# Patient Record
Sex: Female | Born: 1960 | Race: Black or African American | Hispanic: No | Marital: Single | Smoking: Current every day smoker
Health system: Southern US, Community
[De-identification: ages and names within clinical notes are randomized; demographics above are authoritative.]

## PROBLEM LIST (undated history)

## (undated) DIAGNOSIS — F1410 Cocaine abuse, uncomplicated: Secondary | ICD-10-CM

## (undated) DIAGNOSIS — F209 Schizophrenia, unspecified: Secondary | ICD-10-CM

## (undated) DIAGNOSIS — F259 Schizoaffective disorder, unspecified: Secondary | ICD-10-CM

## (undated) DIAGNOSIS — R443 Hallucinations, unspecified: Secondary | ICD-10-CM

## (undated) DIAGNOSIS — F1010 Alcohol abuse, uncomplicated: Secondary | ICD-10-CM

## (undated) DIAGNOSIS — F250 Schizoaffective disorder, bipolar type: Secondary | ICD-10-CM

## (undated) DIAGNOSIS — F1910 Other psychoactive substance abuse, uncomplicated: Secondary | ICD-10-CM

## (undated) HISTORY — PX: ABDOMINAL SURGERY: SHX537

---

## 1998-03-24 ENCOUNTER — Emergency Department (HOSPITAL_COMMUNITY): Admission: EM | Admit: 1998-03-24 | Discharge: 1998-03-24 | Payer: Self-pay | Admitting: Emergency Medicine

## 1998-10-18 ENCOUNTER — Encounter: Payer: Self-pay | Admitting: Emergency Medicine

## 1998-10-18 ENCOUNTER — Emergency Department (HOSPITAL_COMMUNITY): Admission: EM | Admit: 1998-10-18 | Discharge: 1998-10-18 | Payer: Self-pay | Admitting: Emergency Medicine

## 1998-10-20 ENCOUNTER — Emergency Department (HOSPITAL_COMMUNITY): Admission: EM | Admit: 1998-10-20 | Discharge: 1998-10-20 | Payer: Self-pay | Admitting: Emergency Medicine

## 1998-11-05 ENCOUNTER — Inpatient Hospital Stay (HOSPITAL_COMMUNITY): Admission: AD | Admit: 1998-11-05 | Discharge: 1998-11-21 | Payer: Self-pay | Admitting: *Deleted

## 1999-07-21 ENCOUNTER — Emergency Department (HOSPITAL_COMMUNITY): Admission: EM | Admit: 1999-07-21 | Discharge: 1999-07-21 | Payer: Self-pay | Admitting: Emergency Medicine

## 1999-08-23 ENCOUNTER — Emergency Department (HOSPITAL_COMMUNITY): Admission: EM | Admit: 1999-08-23 | Discharge: 1999-08-23 | Payer: Self-pay | Admitting: Emergency Medicine

## 1999-11-08 ENCOUNTER — Emergency Department (HOSPITAL_COMMUNITY): Admission: EM | Admit: 1999-11-08 | Discharge: 1999-11-08 | Payer: Self-pay | Admitting: Emergency Medicine

## 2001-01-10 ENCOUNTER — Emergency Department (HOSPITAL_COMMUNITY): Admission: EM | Admit: 2001-01-10 | Discharge: 2001-01-11 | Payer: Self-pay | Admitting: Emergency Medicine

## 2001-01-11 ENCOUNTER — Encounter: Payer: Self-pay | Admitting: Emergency Medicine

## 2001-10-10 ENCOUNTER — Inpatient Hospital Stay (HOSPITAL_COMMUNITY): Admission: EM | Admit: 2001-10-10 | Discharge: 2001-10-13 | Payer: Self-pay | Admitting: *Deleted

## 2003-01-15 ENCOUNTER — Emergency Department (HOSPITAL_COMMUNITY): Admission: EM | Admit: 2003-01-15 | Discharge: 2003-01-15 | Payer: Self-pay | Admitting: Emergency Medicine

## 2003-01-15 ENCOUNTER — Encounter: Payer: Self-pay | Admitting: Emergency Medicine

## 2003-01-27 ENCOUNTER — Inpatient Hospital Stay (HOSPITAL_COMMUNITY): Admission: EM | Admit: 2003-01-27 | Discharge: 2003-02-01 | Payer: Self-pay | Admitting: Psychiatry

## 2003-02-11 ENCOUNTER — Emergency Department (HOSPITAL_COMMUNITY): Admission: EM | Admit: 2003-02-11 | Discharge: 2003-02-11 | Payer: Self-pay | Admitting: Emergency Medicine

## 2003-02-11 ENCOUNTER — Encounter: Payer: Self-pay | Admitting: Emergency Medicine

## 2003-02-25 ENCOUNTER — Emergency Department (HOSPITAL_COMMUNITY): Admission: EM | Admit: 2003-02-25 | Discharge: 2003-02-25 | Payer: Self-pay | Admitting: Emergency Medicine

## 2003-03-15 ENCOUNTER — Inpatient Hospital Stay (HOSPITAL_COMMUNITY): Admission: AD | Admit: 2003-03-15 | Discharge: 2003-03-19 | Payer: Self-pay | Admitting: Psychiatry

## 2003-05-28 ENCOUNTER — Emergency Department (HOSPITAL_COMMUNITY): Admission: EM | Admit: 2003-05-28 | Discharge: 2003-05-28 | Payer: Self-pay | Admitting: Emergency Medicine

## 2003-11-12 ENCOUNTER — Emergency Department (HOSPITAL_COMMUNITY): Admission: EM | Admit: 2003-11-12 | Discharge: 2003-11-13 | Payer: Self-pay | Admitting: Emergency Medicine

## 2003-12-02 ENCOUNTER — Emergency Department (HOSPITAL_COMMUNITY): Admission: EM | Admit: 2003-12-02 | Discharge: 2003-12-02 | Payer: Self-pay | Admitting: Emergency Medicine

## 2003-12-30 ENCOUNTER — Emergency Department (HOSPITAL_COMMUNITY): Admission: EM | Admit: 2003-12-30 | Discharge: 2003-12-31 | Payer: Self-pay | Admitting: Emergency Medicine

## 2004-02-19 ENCOUNTER — Emergency Department (HOSPITAL_COMMUNITY): Admission: EM | Admit: 2004-02-19 | Discharge: 2004-02-19 | Payer: Self-pay | Admitting: Emergency Medicine

## 2004-02-27 ENCOUNTER — Emergency Department (HOSPITAL_COMMUNITY): Admission: EM | Admit: 2004-02-27 | Discharge: 2004-02-27 | Payer: Self-pay

## 2004-11-20 ENCOUNTER — Emergency Department (HOSPITAL_COMMUNITY): Admission: EM | Admit: 2004-11-20 | Discharge: 2004-11-20 | Payer: Self-pay | Admitting: Emergency Medicine

## 2004-11-21 ENCOUNTER — Emergency Department (HOSPITAL_COMMUNITY): Admission: EM | Admit: 2004-11-21 | Discharge: 2004-11-21 | Payer: Self-pay | Admitting: Emergency Medicine

## 2004-11-27 ENCOUNTER — Emergency Department (HOSPITAL_COMMUNITY): Admission: EM | Admit: 2004-11-27 | Discharge: 2004-11-27 | Payer: Self-pay | Admitting: Emergency Medicine

## 2004-12-05 ENCOUNTER — Emergency Department (HOSPITAL_COMMUNITY): Admission: EM | Admit: 2004-12-05 | Discharge: 2004-12-05 | Payer: Self-pay | Admitting: Emergency Medicine

## 2005-01-18 ENCOUNTER — Emergency Department (HOSPITAL_COMMUNITY): Admission: EM | Admit: 2005-01-18 | Discharge: 2005-01-18 | Payer: Self-pay | Admitting: Emergency Medicine

## 2005-07-25 ENCOUNTER — Emergency Department (HOSPITAL_COMMUNITY): Admission: EM | Admit: 2005-07-25 | Discharge: 2005-07-25 | Payer: Self-pay | Admitting: Emergency Medicine

## 2006-04-24 ENCOUNTER — Emergency Department (HOSPITAL_COMMUNITY): Admission: EM | Admit: 2006-04-24 | Discharge: 2006-04-24 | Payer: Self-pay | Admitting: Emergency Medicine

## 2007-11-06 ENCOUNTER — Emergency Department (HOSPITAL_COMMUNITY): Admission: EM | Admit: 2007-11-06 | Discharge: 2007-11-06 | Payer: Self-pay | Admitting: Emergency Medicine

## 2007-11-21 ENCOUNTER — Emergency Department (HOSPITAL_COMMUNITY): Admission: EM | Admit: 2007-11-21 | Discharge: 2007-11-21 | Payer: Self-pay | Admitting: Emergency Medicine

## 2007-11-28 ENCOUNTER — Encounter (HOSPITAL_COMMUNITY): Admission: RE | Admit: 2007-11-28 | Discharge: 2008-02-26 | Payer: Self-pay | Admitting: Emergency Medicine

## 2008-10-14 ENCOUNTER — Other Ambulatory Visit: Payer: Self-pay | Admitting: Emergency Medicine

## 2008-10-15 ENCOUNTER — Ambulatory Visit: Payer: Self-pay | Admitting: Psychiatry

## 2008-10-15 ENCOUNTER — Other Ambulatory Visit: Payer: Self-pay | Admitting: Emergency Medicine

## 2008-10-15 ENCOUNTER — Inpatient Hospital Stay (HOSPITAL_COMMUNITY): Admission: AD | Admit: 2008-10-15 | Discharge: 2008-10-21 | Payer: Self-pay | Admitting: Psychiatry

## 2009-10-26 ENCOUNTER — Emergency Department (HOSPITAL_COMMUNITY): Admission: EM | Admit: 2009-10-26 | Discharge: 2009-10-26 | Payer: Self-pay | Admitting: Emergency Medicine

## 2009-11-30 ENCOUNTER — Emergency Department (HOSPITAL_COMMUNITY): Admission: EM | Admit: 2009-11-30 | Discharge: 2009-11-30 | Payer: Self-pay | Admitting: Emergency Medicine

## 2009-12-19 ENCOUNTER — Emergency Department (HOSPITAL_COMMUNITY): Admission: EM | Admit: 2009-12-19 | Discharge: 2009-12-19 | Payer: Self-pay | Admitting: Emergency Medicine

## 2009-12-20 ENCOUNTER — Emergency Department (HOSPITAL_COMMUNITY): Admission: EM | Admit: 2009-12-20 | Discharge: 2009-12-21 | Payer: Self-pay | Admitting: Emergency Medicine

## 2009-12-22 ENCOUNTER — Emergency Department (HOSPITAL_COMMUNITY): Admission: EM | Admit: 2009-12-22 | Discharge: 2009-12-22 | Payer: Self-pay | Admitting: Emergency Medicine

## 2009-12-23 ENCOUNTER — Emergency Department (HOSPITAL_COMMUNITY): Admission: EM | Admit: 2009-12-23 | Discharge: 2009-12-23 | Payer: Self-pay | Admitting: Emergency Medicine

## 2009-12-24 ENCOUNTER — Ambulatory Visit: Payer: Self-pay | Admitting: Psychiatry

## 2009-12-24 ENCOUNTER — Inpatient Hospital Stay (HOSPITAL_COMMUNITY): Admission: RE | Admit: 2009-12-24 | Discharge: 2009-12-26 | Payer: Self-pay | Admitting: Psychiatry

## 2010-03-20 ENCOUNTER — Emergency Department (HOSPITAL_COMMUNITY): Admission: EM | Admit: 2010-03-20 | Discharge: 2010-03-20 | Payer: Self-pay | Admitting: Emergency Medicine

## 2010-12-28 LAB — CBC
MCHC: 33.8 g/dL (ref 30.0–36.0)
MCV: 94.7 fL (ref 78.0–100.0)
Platelets: 240 10*3/uL (ref 150–400)

## 2010-12-28 LAB — POCT CARDIAC MARKERS: Troponin i, poc: <0.05 ng/mL (ref 0.00–0.09)

## 2010-12-28 LAB — RAPID URINE DRUG SCREEN, HOSP PERFORMED
Amphetamines: NOT DETECTED
Barbiturates: NOT DETECTED
Benzodiazepines: NOT DETECTED
Opiates: NOT DETECTED

## 2010-12-28 LAB — COMPREHENSIVE METABOLIC PANEL
AST: 45 U/L — ABNORMAL HIGH (ref 0–37)
Albumin: 3.5 g/dL (ref 3.5–5.2)
Calcium: 8.8 mg/dL (ref 8.4–10.5)
Creatinine, Ser: 0.67 mg/dL (ref 0.4–1.2)
GFR calc Af Amer: >60 mL/min (ref >60)

## 2010-12-28 LAB — DIFFERENTIAL
Eosinophils Relative: 2 % (ref 0–5)
Lymphocytes Relative: 18 % (ref 12–46)
Lymphs Abs: 1.9 10*3/uL (ref 0.7–4.0)
Monocytes Absolute: 0.8 10*3/uL (ref 0.1–1.0)

## 2010-12-28 LAB — CK TOTAL AND CKMB (NOT AT ARMC)
Relative Index: 1.6 (ref 0.0–2.5)
Total CK: 847 U/L — ABNORMAL HIGH (ref 7–177)

## 2010-12-28 LAB — URINALYSIS, ROUTINE W REFLEX MICROSCOPIC
Glucose, UA: NEGATIVE mg/dL
Hgb urine dipstick: NEGATIVE
Protein, ur: NEGATIVE mg/dL

## 2010-12-28 LAB — TROPONIN I: Troponin I: 0.01 ng/mL (ref 0.00–0.06)

## 2010-12-30 LAB — CBC
HCT: 37.3 % (ref 36.0–46.0)
Hemoglobin: 12.8 g/dL (ref 12.0–15.0)
MCHC: 34.3 g/dL (ref 30.0–36.0)
MCV: 93.2 fL (ref 78.0–100.0)
RBC: 4.01 MIL/uL (ref 3.87–5.11)

## 2010-12-30 LAB — URINE MICROSCOPIC-ADD ON

## 2010-12-30 LAB — COMPREHENSIVE METABOLIC PANEL
BUN: 6 mg/dL (ref 6–23)
CO2: 29 mEq/L (ref 19–32)
Calcium: 9 mg/dL (ref 8.4–10.5)
Creatinine, Ser: 0.72 mg/dL (ref 0.4–1.2)
GFR calc non Af Amer: >60 mL/min (ref >60)
Glucose, Bld: 93 mg/dL (ref 70–99)
Total Bilirubin: 0.2 mg/dL — ABNORMAL LOW (ref 0.3–1.2)

## 2010-12-30 LAB — RAPID URINE DRUG SCREEN, HOSP PERFORMED
Amphetamines: NOT DETECTED
Barbiturates: NOT DETECTED
Opiates: NOT DETECTED

## 2010-12-30 LAB — DIFFERENTIAL
Basophils Absolute: 0 10*3/uL (ref 0.0–0.1)
Lymphocytes Relative: 39 % (ref 12–46)
Lymphs Abs: 3 10*3/uL (ref 0.7–4.0)
Neutrophils Relative %: 53 % (ref 43–77)

## 2010-12-30 LAB — URINALYSIS, ROUTINE W REFLEX MICROSCOPIC
Bilirubin Urine: NEGATIVE
Hgb urine dipstick: NEGATIVE
Ketones, ur: NEGATIVE mg/dL
Nitrite: NEGATIVE
pH: 7 (ref 5.0–8.0)

## 2010-12-30 LAB — LIPASE, BLOOD: Lipase: 34 U/L (ref 11–59)

## 2011-01-03 LAB — CBC
HCT: 38.7 % (ref 36.0–46.0)
Hemoglobin: 12.1 g/dL (ref 12.0–15.0)
Hemoglobin: 12.4 g/dL (ref 12.0–15.0)
MCHC: 31.9 g/dL (ref 30.0–36.0)
MCV: 96.5 fL (ref 78.0–100.0)
Platelets: 344 10*3/uL (ref 150–400)
RBC: 3.93 MIL/uL (ref 3.87–5.11)
RDW: 13.8 % (ref 11.5–15.5)
RDW: 13.9 % (ref 11.5–15.5)

## 2011-01-03 LAB — COMPREHENSIVE METABOLIC PANEL
ALT: 12 U/L (ref 0–35)
AST: 12 U/L (ref 0–37)
Albumin: 3.8 g/dL (ref 3.5–5.2)
Alkaline Phosphatase: 62 U/L (ref 39–117)
Alkaline Phosphatase: 65 U/L (ref 39–117)
BUN: 8 mg/dL (ref 6–23)
CO2: 30 mEq/L (ref 19–32)
Calcium: 9 mg/dL (ref 8.4–10.5)
Calcium: 9.3 mg/dL (ref 8.4–10.5)
Creatinine, Ser: 0.72 mg/dL (ref 0.4–1.2)
GFR calc Af Amer: >60 mL/min (ref >60)
Glucose, Bld: 79 mg/dL (ref 70–99)
Glucose, Bld: 85 mg/dL (ref 70–99)
Potassium: 3.5 mEq/L (ref 3.5–5.1)
Potassium: 4.4 mEq/L (ref 3.5–5.1)
Sodium: 142 mEq/L (ref 135–145)
Total Protein: 7.4 g/dL (ref 6.0–8.3)
Total Protein: 7.4 g/dL (ref 6.0–8.3)

## 2011-01-03 LAB — RAPID URINE DRUG SCREEN, HOSP PERFORMED
Amphetamines: NOT DETECTED
Barbiturates: NOT DETECTED
Benzodiazepines: NOT DETECTED
Cocaine: NOT DETECTED
Opiates: NOT DETECTED
Opiates: NOT DETECTED
Tetrahydrocannabinol: NOT DETECTED

## 2011-01-03 LAB — DIFFERENTIAL
Basophils Absolute: 0 10*3/uL (ref 0.0–0.1)
Basophils Relative: 0 % (ref 0–1)
Basophils Relative: 0 % (ref 0–1)
Eosinophils Absolute: 0.2 10*3/uL (ref 0.0–0.7)
Eosinophils Relative: 1 % (ref 0–5)
Lymphocytes Relative: 37 % (ref 12–46)
Lymphs Abs: 2.2 10*3/uL (ref 0.7–4.0)
Monocytes Absolute: 0.6 10*3/uL (ref 0.1–1.0)
Monocytes Relative: 3 % (ref 3–12)
Monocytes Relative: 6 % (ref 3–12)
Neutro Abs: 5 10*3/uL (ref 1.7–7.7)
Neutrophils Relative %: 55 % (ref 43–77)
Neutrophils Relative %: 78 % — ABNORMAL HIGH (ref 43–77)

## 2011-01-03 LAB — POCT PREGNANCY, URINE: Preg Test, Ur: NEGATIVE

## 2011-01-03 LAB — ETHANOL: Alcohol, Ethyl (B): 17 mg/dL — ABNORMAL HIGH (ref 0–10)

## 2011-01-25 LAB — DIFFERENTIAL
Lymphocytes Relative: 31 % (ref 12–46)
Lymphs Abs: 2.6 10*3/uL (ref 0.7–4.0)
Neutro Abs: 5.1 10*3/uL (ref 1.7–7.7)
Neutrophils Relative %: 60 % (ref 43–77)

## 2011-01-25 LAB — COMPREHENSIVE METABOLIC PANEL
AST: 16 U/L (ref 0–37)
Calcium: 9.7 mg/dL (ref 8.4–10.5)
Chloride: 106 mEq/L (ref 96–112)
GFR calc non Af Amer: >60 mL/min (ref >60)
Potassium: 3.7 mEq/L (ref 3.5–5.1)
Sodium: 142 mEq/L (ref 135–145)

## 2011-01-25 LAB — RAPID URINE DRUG SCREEN, HOSP PERFORMED
Barbiturates: NOT DETECTED
Benzodiazepines: NOT DETECTED
Cocaine: POSITIVE — AB
Opiates: NOT DETECTED

## 2011-01-25 LAB — URINALYSIS, ROUTINE W REFLEX MICROSCOPIC
Glucose, UA: NEGATIVE mg/dL
Hgb urine dipstick: NEGATIVE
Ketones, ur: NEGATIVE mg/dL
Protein, ur: NEGATIVE mg/dL
pH: 6.5 (ref 5.0–8.0)

## 2011-01-25 LAB — CBC
HCT: 35.4 % — ABNORMAL LOW (ref 36.0–46.0)
Hemoglobin: 12.2 g/dL (ref 12.0–15.0)
Platelets: 294 10*3/uL (ref 150–400)
RDW: 13.2 % (ref 11.5–15.5)
WBC: 8.4 10*3/uL (ref 4.0–10.5)

## 2011-01-25 LAB — POCT PREGNANCY, URINE: Preg Test, Ur: NEGATIVE

## 2011-01-25 LAB — ETHANOL: Alcohol, Ethyl (B): <5 mg/dL (ref 0–10)

## 2011-02-23 NOTE — Consult Note (Signed)
[name], [name] NO.:  [id number]   MEDICAL RECORD NO.:  [id number]          PATIENT TYPE:  IPS   LOCATION:  NA                            FACILITY:  [facility]   PHYSICIAN:  Antonietta Breach, M.D.  DATE OF BIRTH:  [birth year]   DATE OF CONSULTATION:  10/15/2008  DATE OF DISCHARGE:                                 CONSULTATION   REASON FOR CONSULTATION:  Psychosis, cocaine dependence.   HISTORY OF PRESENT ILLNESS:  [name] is a 50 year old female  presenting with a cocaine binge and near syncope.   After the patient had already passed enough time to no longer be cocaine  intoxicated, she was behaving in a bizarre fashion which appears to have  been occurring over the past 2 days.   For example, in the emergency room she urinated on herself and on the  floor 2 times.  She does continue new with a paranoid disposition,  clearly internally stimulated and socially awkward with avoidance.  She  does have illogia  She is not combative.  However, she is moderately  agitated.  She is redirectable.   Her orientation and memory function are grossly intact.  This condition  has not improved with her general medical care.  She appears to have had  the precipitating stress of cocaine intoxication.  She also has stated  that she refuses to go back to where she was living due to the people  that were there.   PAST PSYCHIATRIC HISTORY:  In review of the past medical record  schizophrenia is documented.   Her last psychiatric admission was in June 2004 (last psychiatric  admission at [facility]).  At that time she was demonstrating  some mutism, refusing to answer questions.  She was having  noncooperative behavior and was having loud speech as well as unclear  thought process.  She was discharged with a diagnosis of schizophrenia,  undifferentiated type and substance abuse including alcohol and crack  was noted.   Her discharge medications included Cogentin 1 mg  q.h.s., Haldol  Decanoate 100 mg IM q.4 weeks.   The the patient is not an adequate historian at this time.   FAMILY PSYCHIATRIC HISTORY:  Unknown.   SOCIAL HISTORY:  She had evidently been living in a crack house.  She  does have a sister.  [name] is unemployed and disabled.  Please see  above regarding her substance use and abuse.   PAST MEDICAL HISTORY:  The the patient had presented to the emergency  room for an animal bite in the lower leg in February 2009.  She had  multiple visits for this.   ALLERGIES:  PENICILLIN.   LABORATORY DATA:  Drug screen was positive for cocaine.  Pregnancy test  negative.  Alcohol was negative.  Sodium 142, BUN 8, creatinine 0.65,  glucose 99.  SGOT 16, SGPT 13.  WBC 8.4, hemoglobin 12.2, platelet count  294.   REVIEW OF SYSTEMS:  PSYCHIATRIC:  In the past medical record also listed  regarding psychotropic medications:  In February 2009 it was noted that  she was on Abilify, Depakote, haloperidol and Risperdal, although  whether or not those were all prescribed his standing agents is unclear.   In July 2007 an emergency room visit lists Abilify, Depakote,  haloperidol and Risperdal as well.   The rest of the review of systems was gleaned mainly from the medical  record and staff. Constitutional, head, eyes, ears, nose and throat,  mouth, neurologic, cardiovascular, respiratory, gastrointestinal,  genitourinary, skin, musculoskeletal, hematologic lymphatic, endocrine  metabolic all unremarkable.   PHYSICAL EXAMINATION:  VITAL SIGNS:  Temperature 97.4, pulse 66,  respiratory rate 16, blood pressure 117/71, O2 saturation on room air  98%.  GENERAL APPEARANCE:  [name] is a middle-aged female with no  apparent abnormal involuntary movements.  MENTAL STATUS EXAM:  Her eye contact is intermittent.  She does have  decreased attention, clearly internally distracted with internal  stimulation.  Concentration decreased.  Affect is guarded.   Mood is  anxious.  She is grossly oriented to all spheres.  Memory function is  grossly intact to immediate, recent and remote.  Fund of knowledge and  intelligence are difficult to assess at this time.  Speech involves very  short phrases.  She is very clearly guarded.  There is some edentulation  which is associated with some dysarthria that is mild.  Thought process,  there are short coherent phrases.  Thought content clearly paranoid,  guarded.  Insight is poor.  Judgment is impaired.   ASSESSMENT:  AXIS I:  1. 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  2. Cocaine dependence, rule out polysubstance dependence.  AXIS II:  Deferred.  AXIS III:  See past medical history.  AXIS IV:  Primary support group.  AXIS V:  30.   Mr. Satre could clearly demonstrates with her behavior symptoms a risk  of passive and yet lethal self-neglect secondary to impaired judgment  which is involved in her psychosis.   Therefore, would recommend that she would be admitted to an inpatient  psychiatric unit for further evaluation and treatment.   Regarding psychotropic medication, would utilize supportive Ativan 0.5  to 2 mg p.o. IM or IV q.4 h p.r.n. severe anxiety or agitation.   Would continue to provide low stimulation ego support.   When utilizing the Ativan would be cautious about side effects of ataxia  or slurred speech.   Other psychotropic medication deferred at this time.      Antonietta Breach, M.D.  Electronically Signed     JW/MEDQ  D:  10/15/2008  T:  10/15/2008  Job:  [id number]

## 2011-02-26 NOTE — Discharge Summary (Signed)
[facility]  Patient:    [name], [name] Visit Number: [id number] MRN: [id number]          Service Type: PSY Location: 400 [id number] Attending Physician:  Denny Peon Dictated by:   Jeanice Lim, M.D. Admit Date:  10/10/2001 Discharge Date: 10/13/2001                             Discharge Summary  IDENTIFYING DATA:  A 50 year old single [place] American female involuntarily committed for psychosis and suicidal ideation.  ALLERGIES:  No known drug allergies.  MEDICATIONS:  None.  PAST MEDICAL HISTORY:  The patient has followed up at [facility] in the past, seeing Dr. Katrinka Blazing, and has also been hospitalized at [facility] as an inpatient in 1990.  PHYSICAL EXAMINATION:  Essentially within normal limits.  Vital signs stable. Afebrile.  Neurologically nonfocal.  LABORATORY DATA:  Routine admission laboratories essentially within normal limits including a CBC, CMET.  Urine pregnancy was negative.  Urine drug screen positive for cocaine.  Alcohol less than 5.  GC and chlamydia screen negative.  MENTAL STATUS EXAMINATION:  The patient is in the bed, uncooperative, in a hospital gown, only answering a few questions, disheveled, obese.  Speech relevant to answers, very short, and irritable mood.  Dysphoric and affect angry.  Thought processes are goal directed.  Thought content positive for paranoia.  Positive for auditory hallucinations, as well as visual hallucinations.  Cognition, difficult to evaluate.  Judgment and insight poor.  ADMITTING DIAGNOSIS: Axis I:    Schizophrenia, alcohol abuse, cocaine abuse. Axis II:   None. Axis III:  None. Axis IV:   Moderate problems with primary support. Axis V:    25-55.  The patient was ordered routine p.r.n. medications, given Haldol and Ativan p.r.n. for agitation.  Librium for possible withdrawal symptoms and was started on Zyprexa for psychotic symptoms.  The dose was  adjusted as tolerated and the patient reported a positive response with no side effects.  CONDITION ON DISCHARGE:  Improved.  Mood is more euthymic.  Affect slightly brighter.  Thought processes goal directed.  Thought content for overt psychotic symptoms.  No dangerous ideation.  Judgment and insight have improved and the patient reported motivation to be compliant with followup plan.  DISCHARGE MEDICATIONS:  Haldol Decanoate 150 mg IM q.4 weeks, next due on February 1.  [facility] team and appointment on January 9.  DISCHARGE DIAGNOSES: Axis I:    Schizophrenia, alcohol abuse, cocaine abuse. Axis II:   None. Axis III:  None. Axis IV:   Moderate problems with primary support. Axis V:    25-55. Dictated by:   Jeanice Lim, M.D. Attending Physician:  Denny Peon DD:  11/22/01 TD:  11/23/01 Job: [id number]

## 2011-02-26 NOTE — Discharge Summary (Signed)
NAME:  [name], [name]                         ACCOUNT NO.:  [id number]   MEDICAL RECORD NO.:  [id number]                   PATIENT TYPE:  IPS   LOCATION:  0406                                 FACILITY:  [facility]   PHYSICIAN:  Geoffery Lyons, M.D.                   DATE OF BIRTH:  January 03, 1961   DATE OF ADMISSION:  03/15/2003  DATE OF DISCHARGE:  03/19/2003                                 DISCHARGE SUMMARY   CHIEF COMPLAINT AND PRESENT ILLNESS:  This was one of multiple admissions to  [facility] for this 50 year old black female well-known to  [facility].  Had a seizure the day before.  EMS was called.  She was shaking  all over.  There was no loss of bladder.  Has been smoking crack and using  alcohol.  She was irritable.   PAST PSYCHIATRIC HISTORY:  Numerous times at [facility].  Followed by  [facility] Team and [facility].  On Haldol Decanoate IM  every two weeks.   ALCOHOL/DRUG HISTORY:  Chronic alcohol and crack use.   PAST MEDICAL HISTORY:  No history of any major medical conditions.   MEDICATIONS:  She was taking the Haldol Decanoate 100 mg IM every two weeks.   PHYSICAL EXAMINATION:  Performed and failed to show any acute findings.   MENTAL STATUS EXAM:  Alert female but refuses to answer.  Appears clean.  Appropriately dressed.  Not cooperative.  Speech is loud and mood is  irritable.  Thought processes are not clear.  She does not appear to be  responding to internal stimuli.   ADMISSION DIAGNOSES:   AXIS I:  1. Schizophrenia, undifferentiated-type.  2. Alcohol and cocaine abuse.   AXIS II:  No diagnosis.   AXIS III:  No diagnosis.   AXIS IV:  Moderate.   AXIS V:  Global Assessment of Functioning upon admission 25; highest Global  Assessment of Functioning in the last year 55.   HOSPITAL COURSE:  She was admitted and started intensive individual and  group psychotherapy.  She was given Zyprexa and trazodone for sleep.  She  was detoxified with Librium.  She had had Haldol Decanoate 100 mg IM.  She  denies/minimized her substance use as well as her symptoms.  She apparently  had a seizure secondary to withdrawal.  Using alcohol according to the  sister.  There was some underlying paranoia, some agitation.  On March 19, 2003, she was much improved.  She wanted to go home.  There were no suicidal  or homicidal ideation.  Wanting to pursue further outpatient treatment.  The  [facility] Team was going to keep up with her.  So, as she was endorsing no active  symptomatology, no suicidal or homicidal ideation, was willing to follow up  and she was going to be followed closely by [facility] Team, we discharged to  outpatient  treatment.   DISCHARGE DIAGNOSES:   AXIS I:  1. Schizophrenia, undifferentiated.  2. Alcohol and cocaine dependence.   AXIS III:  No diagnosis.   AXIS III:  No diagnosis.   AXIS IV:  Moderate.   AXIS V:  Global Assessment of Functioning upon discharge 50.   DISCHARGE MEDICATIONS:  1. Cogentin 1 mg at night.  2. Haldol Decanoate 100 mg IM every four weeks.  Was given on March 16, 2003.   FOLLOW UP:  PAC Team.                                                Geoffery Lyons, M.D.    IL/MEDQ  D:  04/17/2003  T:  04/18/2003  Job:  [id number]

## 2011-02-26 NOTE — H&P (Signed)
NAME:  [name], [name]                         ACCOUNT NO.:  [id number]   MEDICAL RECORD NO.:  [id number]                   PATIENT TYPE:  IPS   LOCATION:  0401                                 FACILITY:  [facility]   PHYSICIAN:  Jeanice Lim, M.D.              DATE OF BIRTH:  04/01/1961   DATE OF ADMISSION:  DATE OF DISCHARGE:                         PSYCHIATRIC ADMISSION ASSESSMENT   IDENTIFYING INFORMATION:  This is a voluntary admission. Identifying  information comes from the patient and available records. This is a 50-year-  old black single female. She has a long history for schizophrenia, alcohol  and cocaine dependence. She presented complaining of auditory  hallucinations, command in nature, that she lay down in traffic. The patient  is currently homeless. She is followed by the PEP team at [facility], and will be helped to regain housing once discharged. She was  sent to [facility] for medical clearance. She is refusing to  participate in her admission assessment at this time.   PAST PSYCHIATRIC HISTORY:  She  has had multiple past admissions. The  records that are available indeed show the same type of presentation. The  patient presented with various hallucinations and paranoia and also using  cocaine and alcohol.   SOCIAL HISTORY:  She is a 50 year old black single female. Old records  indicate that she has a child, sex is unstated, who is in their 49s, and  another child who has been deceased. However, the reason for death is not  indicated.   FAMILY HISTORY:  None known.   ALCOHOL AND DRUG HISTORY:  Extensive alcohol and cocaine abuse.   PAST MEDICAL HISTORY:  She denies primary care physician and medical  history. We will try to get that information from the ACT team. There are no  known medical problems. She was medically cleared at [facility].   CURRENT MEDICATIONS:  1. Haldol 100 mg/cc 2 cc every 4 weeks. Her last  injection was January 04, 2003, at [facility].  2. She is also supposed to take Cogentin 2 mg q.h.s., however, she says she     does not.   ALLERGIES:  PENICILLIN.   PHYSICAL EXAMINATION:  The patient refuses examination but was medically  cleared at the emergency room.   LABORATORY DATA:  Her urine pregnancy test is negative. Her alcohol level  was 23. Her urine drug screen was positive for cocaine. There were no not  positive findings.   MENTAL STATUS EXAM:  The patient refuses to answer questions. She was in  bed. She appeared somewhat angry and is not responding  to internal stimuli  at the time of this examination.   ADMISSION DIAGNOSES:    AXIS I:  1. Schizophrenia.  2. Alcohol abuse.  3. Cocaine abuse.   AXIS II:  Deferred.   AXIS III:  Unknown.  AXIS IV:  Homeless. No known legal issues at this time.   AXIS V:  She is currently 35.   PLAN:  The initial plan of care, she is admitted for treatment of her  auditory hallucinations and polysubstance abuse. We will help her contract  for safety and will contact [facility] once the patient  is stabilized so housing can be arranged.     Sallye Lat, P.A.                        Jeanice Lim, M.D.    MA/MEDQ  D:  01/27/2003  T:  01/28/2003  Job:  [id number]

## 2011-02-26 NOTE — H&P (Signed)
NAME:  [name], BACHTEL                         ACCOUNT NO.:  [id number]   MEDICAL RECORD NO.:  [id number]                   PATIENT TYPE:  IPS   LOCATION:  0406                                 FACILITY:  [facility]   PHYSICIAN:  Geoffery Lyons, M.D.                   DATE OF BIRTH:  09-12-1961   DATE OF ADMISSION:  03/15/2003  DATE OF DISCHARGE:                         PSYCHIATRIC ADMISSION ASSESSMENT   This is a voluntary admission.   IDENTIFYING INFORMATION:  The patient and old records.   REASON FOR ADMISSION AND SYMPTOMS:  The patient, a 50 year old black female  well known to [facility], was reported to have had a  seizure yesterday.  Her sister called EMS after she observed the patient  shaking all over.  There was no loss of bladder control.  The patient  reportedly had been smoking crack and using alcohol.  When emergency  services arrived, the patient was able to walk on her own to the ambulance,  was irritable, however no other symptoms were noted.  In the emergency room,  her urine pregnancy test was negative.  Her alcohol was less than five and  her urine drug screen was completely negative.  She had no substances noted  in her system.   PAST PSYCHIATRIC HISTORY:  The patient has had numerous prior admissions,  three here at behavioral health since January alone.  She is followed by the  pact team at [facility].  She is currently being treated  with Haldol decanoate 100 mg I.V. q. 2 weeks.  Her last injection was Mar 04, 2003.  She is due again on March 18, 2003.   SOCIAL HISTORY:  The patient is not willing to speak at this time, however  she is living with her sister.   FAMILY HISTORY:  Unknown.   ALCOHOL AND DRUG HISTORY:  Chronic alcohol and crack abuse.   MEDICAL HISTORY:  Primary care Lianny Molter is through [facility].  She has no  known medical problems and takes no other medications other than her  psychiatric meds.   DRUG  ALLERGIES:  No known drug allergies.   POSITIVE PHYSICAL FINDINGS:  Examination was limited to exam at the bedside.  Specifically, her lungs were clear to auscultation and percussion.  HEART:  Revealed a regular rate and rhythm without murmurs, rubs, or  gallops.  ABDOMEN:  Soft.  It was distended.  There was no hepatosplenomegaly.  No  masses or tenderness noted.  MUSCULOSKELETAL:  Revealed no clubbing, cyanosis, or edema.  The patient  refused to answer questions regarding the review of systems.   MENTAL STATUS EXAM:  The patient is alert, however she refuses to answer.  Her appearance is clean.  She is appropriately dressed.  Her behavior is not  cooperative.  Her speech is loud.  It speeded up at times.  Her mood is  irritable.  Her thought process is not clear.  She did not appear to be  responding to internal stimuli at this time.    DIAGNOSES:   AXIS I:  1. Schizophrenia, undifferentiated type.  2. Substance abuse, chronic, alcohol and crack.   AXIS II:  None known.   AXIS III:  None known.   AXIS IV:  Moderate problems with primary support group, problems related to  social environment, housing problem, economic problem, problems related to  legal system and crime.   AXIS V:  25.   PLAN:  Admit for stabilization of her crisis, to maximize her medication, to  evaluate for any medical issues.  Discharge plans - she will be going back  to live with her family in her current placement and she will continue with  the pact team down at [facility]  Clementeen Hoof is her  contact.      Mickie Leonarda Salon, P.A.-C.               Geoffery Lyons, M.D.    MD/MEDQ  D:  03/16/2003  T:  03/16/2003  Job:  [id number]

## 2011-02-26 NOTE — H&P (Signed)
[facility]  Patient:    [name], [name] Visit Number: [id number] MRN: [id number]          Service Type: EMS Location: [facility] Attending Physician:  Devoria Albe Dictated by:   Candi Leash. Orsini, N.P. Admit Date:  10/09/2001 Discharge Date: 10/10/2001                     Psychiatric Admission Assessment  IDENTIFYING INFORMATION:  A 50 year old single African-American female involuntarily committed on October 10, 2001, for psychosis and suicidal ideation.  HISTORY OF PRESENT ILLNESS:  The patient presents on commitment papers stating that the patient has a history of schizophrenia, has been off her medications, and currently psychotic.   The patient is paranoid, reporting visual hallucinations, also admits to abusing alcohol and cocaine.  The patient is uncooperative for interview.  Information is obtained from the chart.  The patient does admit to hearing voices.  PAST PSYCHIATRIC HISTORY:  Patient attends [facility], sees Dr. Katrinka Blazing.  She was also [facility] as an [facility].  SOCIAL HISTORY:  She is a 50 year old single African-American female with 2 children.  She has a 38 year old, states 1 has deceased.  She lives alone. The patient has been charged with trespassing.  FAMILY HISTORY:  None.  ALCOHOL DRUG HISTORY:  Patient abuses alcohol and cocaine, uncertain about the amount and how often and last drink available.  PAST MEDICAL HISTORY:  Primary care Ayyub Krall is none.  Medical problems are unknown.  Medications are unknown.  DRUG ALLERGIES:  No known allergies.  PHYSICAL EXAMINATION:  Physical examination was performed at [facility], where patient was brought in by [facility].  CBC was within normal limits.  CMET was within normal limits. Urine pregnancy test was negative.  Urine drug screen was positive for cocaine.  Alcohol level was less than 5.  GC and chlamydia screen  were negative.  MENTAL STATUS EXAMINATION:  The patient is in the bed.  She is uncooperative. She is in a hospital gown, would not answer but a few questions, and appears disheveled and obese.  Speech is relevant answers, very short answers.  Her affect is angry.  Thought processes are positive paranoia, positive auditory hallucinations as well as positive visual hallucinations, unable to assess lethality.  Patient did have positive suicidal ideation on day of admission. Cognitive abilities unable to assess.  Patient is oriented to her age.  Her judgment and insight is poor.  ADMISSION DIAGNOSES: Axis I:    1. Schizophrenia.            2. Alcohol abuse.            3. Cocaine abuse. Axis II:   Deferred. Axis III:  Unknown. Axis IV:   Problems with primary support group, lack of, and occupation, and            other psychosocial problems. Axis V:    Current 25, estimated this past year 61.  INITIAL PLAN OF CARE:  Involuntary commitment for psychosis, suicidal ideation, and polysubstance abuse.  Contract for safety, check every 15 minutes.  Will contact [facility] for medication history. Will have Librium available for withdrawal symptoms.  Will obtain further labs.  Our goal is to stabilize her mood and thinking so patient can be safe, to be medication compliant, to be alcohol and drug free, to follow up with mental health.  TENTATIVE LENGTH OF STAY:  4  to 5 days. Dictated by:   Candi Leash. Orsini, N.P. Attending Physician:  Devoria Albe DD:  10/10/01 TD:  10/10/01 Job: [id number]

## 2011-02-26 NOTE — Discharge Summary (Signed)
[name], PATNAUDE NO.:  [id number]   MEDICAL RECORD NO.:  [id number]          PATIENT TYPE:  IPS   LOCATION:  0401                          FACILITY:  [facility]   PHYSICIAN:  Anselm Jungling, MD  DATE OF BIRTH:  [birth year]   DATE OF ADMISSION:  10/15/2008  DATE OF DISCHARGE:  10/21/2008                               DISCHARGE SUMMARY   IDENTIFYING DATA AND REASON FOR ADMISSION:  The patient is a 50 year old  African American female admitted with psychosis.  She had been living in  a group home.  Please refer to the admission note for further details  pertaining to the symptoms, circumstances, and history that led to her  hospitalization.  She was given an initial Axis I diagnosis of paranoid  schizophrenia versus manic with psychosis.   MEDICAL AND LABORATORY:  The patient is in good health without any  active or chronic medical problems.  She was medically and physically  assessed by the psychiatric nurse practitioner.  There were no  significant medical issues.   HOSPITAL COURSE:  The patient was admitted to the adult inpatient  service.  She presented as a well-nourished, normally developed,  edentulous woman who was highly irritable, suspicious, and refused to  talk to me and also verbalized her reluctance to interact with any female  staff.   She was treated with a psychotropic regimen of Haldol that was  eventually adjusted to a dose of 10 mg q.a.m. and 20 mg q.h.s.  Cogentin  1 mg b.i.d. was used as well.  Over the 6-day period of her hospital  stay, she gradually became more relaxed, open to interaction, less  suspicious, and more pleasant.  She was generally cooperative and  redirectable.  She appeared to be responding to internal stimuli to a  lesser degree.   The patient had been a client of the [employer] [facility] [employer].  Case management was in touch with them and we  coordinated her discharge and aftercare planning.  She was  appropriate  for discharge on the 6th hospital day.   AFTERCARE:  The patient was to follow up with the [facility], who were to  see the patient on the day after discharge.   DISCHARGE MEDICATIONS:  1. Haldol 10 mg q.a.m., 20 mg q.h.s.  2. Cogentin 1 mg b.i.d..   DISCHARGE DIAGNOSES:  AXIS I:  Schizophrenia, chronic undifferentiated  type, acute exacerbation, resolving.  AXIS II:  Deferred.  AXIS III:  No acute or chronic illnesses.  AXIS IV: Stressors severe.  AXIS V:  Global Assessment of Functioning on discharge 55.      Anselm Jungling, MD  Electronically Signed     SPB/MEDQ  D:  10/24/2008  T:  10/24/2008  Job:  [id number]

## 2011-02-26 NOTE — Discharge Summary (Signed)
NAME:  [name], [name]                         ACCOUNT NO.:  [id number]   MEDICAL RECORD NO.:  [id number]                   PATIENT TYPE:  IPS   LOCATION:  0401                                 FACILITY:  BH   PHYSICIAN:  Geoffery Lyons, M.D.                   DATE OF BIRTH:  November 05, 1960   DATE OF ADMISSION:  01/27/2003  DATE OF DISCHARGE:  02/01/2003                                 DISCHARGE SUMMARY   CHIEF COMPLAINT AND PRESENT ILLNESS:  This was one of multiple admissions to  [facility] for this 50 year old black single female  with a history of schizophrenia, alcohol and cocaine dependence.  She  complained of auditory hallucinations command in nature that she lie down in  traffic.  She was currently homeless, followed PEP at [facility].   PAST PSYCHIATRIC HISTORY:  She had multiple stays, usually the same kind of  presentation, various hallucinations and paranoia.  She was also using  alcohol and cocaine.   SUBSTANCE ABUSE HISTORY:  As already stated, persistent use of alcohol and  cocaine; unable to ascertain.   PAST MEDICAL HISTORY:  She had no history of any major medical conditions.   MEDICATIONS:  1. Haldol Decanoate 100 mg every four weeks; last injection March 26.  2. Cogentin 2 mg daily.   PHYSICAL EXAMINATION:  Physical examination was performed, failed to show  any acute findings.   MENTAL STATUS EXAM:  Mental status exam revealed a female in bed, refused to  answers questions, angry, not responding to internal stimuli at the time of  the evaluation.   ADMISSION DIAGNOSES:   AXIS I:  1. Schizophrenia, undifferentiated type.  2. Alcohol and cocaine abuse, rule out dependence.   AXIS II:  No diagnosis.   AXIS III:  No diagnosis.   AXIS IV:  Moderate.   AXIS V:  Global assessment of functioning upon admission 30-35, highest  global assessment of functioning in the last year 55-60.   LABORATORY DATA:  Other laboratory workup:  Within normal limits.   HOSPITAL COURSE:  She was admitted and started in intensive individual and  group psychotherapy.  She was given Zyprexa 10 mg at bedtime and Ambien 10  mg at bedtime as well as Cogentin.  She was detoxified using Librium.  The  Zyprexa was increased to 15 mg.  She admitted to having a very difficult  time being on the unit.  She wanted to leave, not cooperative, in bed,  unwilling interaction, oppositional, would not cooperate with the staff.  She had multiple needs.  Mood: Anxious, agitated.  Affect: Agitated.  Thought process: Positive for auditory hallucinations.  As the  hospitalization progressed, she was more alert, evidencing paranoia but  still minimizing.  Options to discharge were considered, possibility of  staying with her sister but then she wanted to go to a hotel.  Apparently,  the sister put some controls on her so she did not want to go there.  It was  felt that she was baseline according to __________ report as well as report  from staff, the PACT Team.  So, on April 23, she was baseline, mood anxious  anticipating discharge but no evidence of psychosis, no delusions, no  hallucinations, no suicidal ideas, no homicidal ideas, wanting to be  discharged to a friend's house.  PACT Team was going to stay on top of her.   DISCHARGE DIAGNOSES:   AXIS I:  1. Schizophrenia, undifferentiated.  2. Alcohol and cocaine abuse.   AXIS II:  No diagnosis.   AXIS III:  No diagnosis.   AXIS IV:  Moderate.   AXIS V:  Global assessment of functioning upon discharge 50.   DISCHARGE MEDICATIONS:  1. Cogentin 2 mg daily.  2. Zyprexa 15 mg at bedtime.   FOLLOW UP:  She was to follow up at [facility].                                                 Geoffery Lyons, M.D.    IL/MEDQ  D:  02/27/2003  T:  02/28/2003  Job:  [id number]

## 2011-04-17 ENCOUNTER — Emergency Department (HOSPITAL_COMMUNITY)
Admission: EM | Admit: 2011-04-17 | Discharge: 2011-04-17 | Disposition: A | Discharge status: Home / Self Care | Payer: Medicaid Other | Attending: Emergency Medicine | Admitting: Emergency Medicine

## 2011-04-17 DIAGNOSIS — F329 Major depressive disorder, single episode, unspecified: Secondary | ICD-10-CM | POA: Insufficient documentation

## 2011-04-17 DIAGNOSIS — R404 Transient alteration of awareness: Secondary | ICD-10-CM | POA: Insufficient documentation

## 2011-04-17 DIAGNOSIS — Z5900 Homelessness unspecified: Secondary | ICD-10-CM | POA: Insufficient documentation

## 2011-04-17 DIAGNOSIS — F3289 Other specified depressive episodes: Secondary | ICD-10-CM | POA: Insufficient documentation

## 2011-04-17 DIAGNOSIS — Z046 Encounter for general psychiatric examination, requested by authority: Secondary | ICD-10-CM | POA: Insufficient documentation

## 2011-04-17 DIAGNOSIS — F22 Delusional disorders: Secondary | ICD-10-CM | POA: Insufficient documentation

## 2011-04-17 DIAGNOSIS — IMO0002 Reserved for concepts with insufficient information to code with codable children: Secondary | ICD-10-CM | POA: Insufficient documentation

## 2011-04-17 LAB — CBC
HCT: 39.8 % (ref 36.0–46.0)
Hemoglobin: 13.2 g/dL (ref 12.0–15.0)
MCHC: 33.2 g/dL (ref 30.0–36.0)
MCV: 89.2 fL (ref 78.0–100.0)
RDW: 13 % (ref 11.5–15.5)
WBC: 9.2 10*3/uL (ref 4.0–10.5)

## 2011-04-17 LAB — RAPID URINE DRUG SCREEN, HOSP PERFORMED
Amphetamines: NOT DETECTED
Opiates: NOT DETECTED

## 2011-04-17 LAB — ETHANOL: Alcohol, Ethyl (B): <11 mg/dL (ref 0–11)

## 2011-04-17 LAB — BASIC METABOLIC PANEL
CO2: 25 mEq/L (ref 19–32)
Calcium: 9.8 mg/dL (ref 8.4–10.5)
Chloride: 106 mEq/L (ref 96–112)
Glucose, Bld: 105 mg/dL — ABNORMAL HIGH (ref 70–99)
Potassium: 4.1 mEq/L (ref 3.5–5.1)
Sodium: 141 mEq/L (ref 135–145)

## 2011-04-17 LAB — URINALYSIS, ROUTINE W REFLEX MICROSCOPIC
Glucose, UA: NEGATIVE mg/dL
Hgb urine dipstick: NEGATIVE
Specific Gravity, Urine: 1.027 (ref 1.005–1.030)
Urobilinogen, UA: 1 mg/dL (ref 0.0–1.0)

## 2011-04-17 LAB — URINE MICROSCOPIC-ADD ON

## 2011-04-17 LAB — DIFFERENTIAL
Eosinophils Relative: 1 % (ref 0–5)
Lymphocytes Relative: 34 % (ref 12–46)
Lymphs Abs: 3.1 10*3/uL (ref 0.7–4.0)
Monocytes Absolute: 0.6 10*3/uL (ref 0.1–1.0)
Neutro Abs: 5.5 10*3/uL (ref 1.7–7.7)

## 2011-04-21 ENCOUNTER — Emergency Department (HOSPITAL_COMMUNITY)
Admission: EM | Admit: 2011-04-21 | Discharge: 2011-04-22 | Disposition: A | Discharge status: Other Acute Inpatient Hospital | Payer: Medicaid Other | Source: Home / Self Care | Attending: Emergency Medicine | Admitting: Emergency Medicine

## 2011-04-21 DIAGNOSIS — Z046 Encounter for general psychiatric examination, requested by authority: Secondary | ICD-10-CM | POA: Insufficient documentation

## 2011-04-21 DIAGNOSIS — F209 Schizophrenia, unspecified: Secondary | ICD-10-CM | POA: Insufficient documentation

## 2011-04-21 LAB — BASIC METABOLIC PANEL
CO2: 27 mEq/L (ref 19–32)
Chloride: 102 mEq/L (ref 96–112)
Creatinine, Ser: 0.61 mg/dL (ref 0.50–1.10)
Glucose, Bld: 104 mg/dL — ABNORMAL HIGH (ref 70–99)

## 2011-04-21 LAB — URINALYSIS, ROUTINE W REFLEX MICROSCOPIC
Bilirubin Urine: NEGATIVE
Glucose, UA: NEGATIVE mg/dL
Leukocytes, UA: NEGATIVE
Nitrite: NEGATIVE
Specific Gravity, Urine: 1.014 (ref 1.005–1.030)
pH: 6 (ref 5.0–8.0)

## 2011-04-21 LAB — DIFFERENTIAL
Basophils Relative: 0 % (ref 0–1)
Lymphocytes Relative: 21 % (ref 12–46)
Lymphs Abs: 2.5 10*3/uL (ref 0.7–4.0)
Monocytes Absolute: 0.7 10*3/uL (ref 0.1–1.0)
Monocytes Relative: 6 % (ref 3–12)
Neutro Abs: 8.7 10*3/uL — ABNORMAL HIGH (ref 1.7–7.7)
Neutrophils Relative %: 72 % (ref 43–77)

## 2011-04-21 LAB — CBC
HCT: 37.1 % (ref 36.0–46.0)
Hemoglobin: 12.2 g/dL (ref 12.0–15.0)
MCH: 29.8 pg (ref 26.0–34.0)
MCHC: 32.9 g/dL (ref 30.0–36.0)
RBC: 4.09 MIL/uL (ref 3.87–5.11)

## 2011-04-21 LAB — RAPID URINE DRUG SCREEN, HOSP PERFORMED
Amphetamines: NOT DETECTED
Benzodiazepines: NOT DETECTED
Cocaine: POSITIVE — AB

## 2011-04-21 LAB — POCT PREGNANCY, URINE: Preg Test, Ur: NEGATIVE

## 2011-04-21 LAB — ETHANOL: Alcohol, Ethyl (B): <11 mg/dL (ref 0–11)

## 2011-04-22 ENCOUNTER — Inpatient Hospital Stay (HOSPITAL_COMMUNITY)
Admission: AD | Admit: 2011-04-22 | Discharge: 2011-04-26 | DRG: 885 | Disposition: A | Discharge status: Home / Self Care | Payer: Medicaid Other | Source: Ambulatory Visit | Attending: Psychiatry | Admitting: Psychiatry

## 2011-04-22 DIAGNOSIS — G4700 Insomnia, unspecified: Secondary | ICD-10-CM

## 2011-04-22 DIAGNOSIS — R45850 Homicidal ideations: Secondary | ICD-10-CM

## 2011-04-22 DIAGNOSIS — Z5900 Homelessness unspecified: Secondary | ICD-10-CM

## 2011-04-22 DIAGNOSIS — F200 Paranoid schizophrenia: Principal | ICD-10-CM

## 2011-04-22 DIAGNOSIS — F1920 Other psychoactive substance dependence, uncomplicated: Secondary | ICD-10-CM

## 2011-04-22 DIAGNOSIS — R45851 Suicidal ideations: Secondary | ICD-10-CM

## 2011-04-22 DIAGNOSIS — F29 Unspecified psychosis not due to a substance or known physiological condition: Secondary | ICD-10-CM

## 2011-04-22 DIAGNOSIS — F1420 Cocaine dependence, uncomplicated: Secondary | ICD-10-CM

## 2011-04-23 DIAGNOSIS — F200 Paranoid schizophrenia: Secondary | ICD-10-CM
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
## 2011-05-25 ENCOUNTER — Emergency Department (HOSPITAL_COMMUNITY)
Admission: EM | Admit: 2011-05-25 | Discharge: 2011-05-25 | Disposition: A | Discharge status: Home / Self Care | Payer: Medicaid Other | Attending: Emergency Medicine | Admitting: Emergency Medicine

## 2011-05-25 ENCOUNTER — Emergency Department (HOSPITAL_COMMUNITY): Payer: Medicaid Other

## 2011-05-25 DIAGNOSIS — I517 Cardiomegaly: Secondary | ICD-10-CM | POA: Insufficient documentation

## 2011-05-25 DIAGNOSIS — Z5900 Homelessness unspecified: Secondary | ICD-10-CM | POA: Insufficient documentation

## 2011-05-25 DIAGNOSIS — F209 Schizophrenia, unspecified: Secondary | ICD-10-CM | POA: Insufficient documentation

## 2011-05-25 DIAGNOSIS — R109 Unspecified abdominal pain: Secondary | ICD-10-CM | POA: Insufficient documentation

## 2011-05-25 LAB — PREGNANCY, URINE: Preg Test, Ur: NEGATIVE

## 2011-05-25 LAB — RAPID URINE DRUG SCREEN, HOSP PERFORMED
Amphetamines: NOT DETECTED
Opiates: NOT DETECTED

## 2011-05-25 LAB — DIFFERENTIAL
Basophils Absolute: 0 10*3/uL (ref 0.0–0.1)
Basophils Relative: 0 % (ref 0–1)
Monocytes Absolute: 0.6 10*3/uL (ref 0.1–1.0)
Neutro Abs: 8.2 10*3/uL — ABNORMAL HIGH (ref 1.7–7.7)
Neutrophils Relative %: 72 % (ref 43–77)

## 2011-05-25 LAB — COMPREHENSIVE METABOLIC PANEL
Alkaline Phosphatase: 70 U/L (ref 39–117)
BUN: 6 mg/dL (ref 6–23)
Chloride: 103 mEq/L (ref 96–112)
Glucose, Bld: 89 mg/dL (ref 70–99)
Potassium: 3.5 mEq/L (ref 3.5–5.1)
Total Bilirubin: 0.2 mg/dL — ABNORMAL LOW (ref 0.3–1.2)
Total Protein: 7.9 g/dL (ref 6.0–8.3)

## 2011-05-25 LAB — CBC
Hemoglobin: 13 g/dL (ref 12.0–15.0)
MCHC: 32.7 g/dL (ref 30.0–36.0)
WBC: 11.5 10*3/uL — ABNORMAL HIGH (ref 4.0–10.5)

## 2011-05-26 ENCOUNTER — Emergency Department (HOSPITAL_COMMUNITY)
Admission: EM | Admit: 2011-05-26 | Discharge: 2011-05-26 | Disposition: A | Discharge status: Home / Self Care | Payer: Medicaid Other | Attending: Emergency Medicine | Admitting: Emergency Medicine

## 2011-05-26 DIAGNOSIS — Z0000 Encounter for general adult medical examination without abnormal findings: Secondary | ICD-10-CM | POA: Insufficient documentation

## 2011-05-26 DIAGNOSIS — Z765 Malingerer [conscious simulation]: Secondary | ICD-10-CM | POA: Insufficient documentation

## 2011-05-26 LAB — ETHANOL: Alcohol, Ethyl (B): <11 mg/dL (ref 0–11)

## 2011-05-26 LAB — DIFFERENTIAL
Lymphocytes Relative: 28 % (ref 12–46)
Lymphs Abs: 2.6 10*3/uL (ref 0.7–4.0)
Monocytes Absolute: 0.8 10*3/uL (ref 0.1–1.0)
Monocytes Relative: 8 % (ref 3–12)
Neutro Abs: 6 10*3/uL (ref 1.7–7.7)
Neutrophils Relative %: 63 % (ref 43–77)

## 2011-05-26 LAB — CBC
HCT: 35.9 % — ABNORMAL LOW (ref 36.0–46.0)
Hemoglobin: 12 g/dL (ref 12.0–15.0)
MCH: 30.5 pg (ref 26.0–34.0)
MCHC: 33.4 g/dL (ref 30.0–36.0)
MCV: 91.1 fL (ref 78.0–100.0)
RBC: 3.94 MIL/uL (ref 3.87–5.11)

## 2011-05-26 LAB — COMPREHENSIVE METABOLIC PANEL
ALT: 10 U/L (ref 0–35)
BUN: 10 mg/dL (ref 6–23)
CO2: 26 mEq/L (ref 19–32)
Calcium: 9.5 mg/dL (ref 8.4–10.5)
GFR calc Af Amer: >60 mL/min (ref >60)
GFR calc non Af Amer: >60 mL/min (ref >60)
Glucose, Bld: 87 mg/dL (ref 70–99)
Sodium: 138 mEq/L (ref 135–145)
Total Protein: 7.3 g/dL (ref 6.0–8.3)

## 2011-05-30 ENCOUNTER — Emergency Department (HOSPITAL_COMMUNITY)
Admission: EM | Admit: 2011-05-30 | Discharge: 2011-06-01 | Disposition: A | Discharge status: Other Acute Inpatient Hospital | Payer: Medicaid Other | Source: Home / Self Care | Attending: Emergency Medicine | Admitting: Emergency Medicine

## 2011-05-30 DIAGNOSIS — R45851 Suicidal ideations: Secondary | ICD-10-CM | POA: Insufficient documentation

## 2011-05-30 DIAGNOSIS — R443 Hallucinations, unspecified: Secondary | ICD-10-CM | POA: Insufficient documentation

## 2011-05-30 DIAGNOSIS — Z5900 Homelessness unspecified: Secondary | ICD-10-CM | POA: Insufficient documentation

## 2011-05-30 LAB — RAPID URINE DRUG SCREEN, HOSP PERFORMED
Amphetamines: NOT DETECTED
Barbiturates: NOT DETECTED
Benzodiazepines: NOT DETECTED
Cocaine: POSITIVE — AB
Opiates: NOT DETECTED
Tetrahydrocannabinol: NOT DETECTED

## 2011-05-30 LAB — DIFFERENTIAL
Basophils Absolute: 0 10*3/uL (ref 0.0–0.1)
Basophils Relative: 0 % (ref 0–1)
Eosinophils Relative: 1 % (ref 0–5)
Lymphocytes Relative: 34 % (ref 12–46)
Monocytes Absolute: 0.5 10*3/uL (ref 0.1–1.0)
Neutro Abs: 5.4 10*3/uL (ref 1.7–7.7)

## 2011-05-30 LAB — CBC
Hemoglobin: 12.5 g/dL (ref 12.0–15.0)
MCHC: 33.5 g/dL (ref 30.0–36.0)
RDW: 13.7 % (ref 11.5–15.5)
WBC: 9.2 10*3/uL (ref 4.0–10.5)

## 2011-05-30 LAB — BASIC METABOLIC PANEL WITH GFR
BUN: 6 mg/dL (ref 6–23)
CO2: 28 meq/L (ref 19–32)
Calcium: 9.9 mg/dL (ref 8.4–10.5)
Chloride: 102 meq/L (ref 96–112)
Creatinine, Ser: 0.56 mg/dL (ref 0.50–1.10)
GFR calc Af Amer: >60 mL/min
GFR calc non Af Amer: >60 mL/min
Glucose, Bld: 107 mg/dL — ABNORMAL HIGH (ref 70–99)
Potassium: 3.7 meq/L (ref 3.5–5.1)
Sodium: 138 meq/L (ref 135–145)

## 2011-05-30 LAB — ETHANOL: Alcohol, Ethyl (B): <11 mg/dL (ref 0–11)

## 2011-06-01 ENCOUNTER — Inpatient Hospital Stay (HOSPITAL_COMMUNITY)
Admission: AD | Admit: 2011-06-01 | Discharge: 2011-06-15 | DRG: 885 | Disposition: A | Discharge status: Nursing Home (Includes ICF) | Payer: Medicaid Other | Source: Ambulatory Visit | Attending: Psychiatry | Admitting: Psychiatry

## 2011-06-01 DIAGNOSIS — R45851 Suicidal ideations: Secondary | ICD-10-CM

## 2011-06-01 DIAGNOSIS — F1020 Alcohol dependence, uncomplicated: Secondary | ICD-10-CM

## 2011-06-01 DIAGNOSIS — Z9119 Patient's noncompliance with other medical treatment and regimen: Secondary | ICD-10-CM

## 2011-06-01 DIAGNOSIS — Z111 Encounter for screening for respiratory tuberculosis: Secondary | ICD-10-CM

## 2011-06-01 DIAGNOSIS — F1420 Cocaine dependence, uncomplicated: Secondary | ICD-10-CM

## 2011-06-01 DIAGNOSIS — Z91199 Patient's noncompliance with other medical treatment and regimen due to unspecified reason: Secondary | ICD-10-CM

## 2011-06-01 DIAGNOSIS — Z79899 Other long term (current) drug therapy: Secondary | ICD-10-CM

## 2011-06-01 DIAGNOSIS — F200 Paranoid schizophrenia: Principal | ICD-10-CM

## 2011-06-02 DIAGNOSIS — F1420 Cocaine dependence, uncomplicated: Secondary | ICD-10-CM

## 2011-06-02 DIAGNOSIS — F1020 Alcohol dependence, uncomplicated: Secondary | ICD-10-CM

## 2011-06-02 DIAGNOSIS — F200 Paranoid schizophrenia: Secondary | ICD-10-CM
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
## 2011-06-12 DIAGNOSIS — F39 Unspecified mood [affective] disorder: Secondary | ICD-10-CM

## 2011-06-16 NOTE — Discharge Summary (Signed)
  [name], [name]               ACCOUNT NO.:  [id number]  MEDICAL RECORD NO.:  [id number]  LOCATION:  0405                          FACILITY:  BH  PHYSICIAN:  Eulogio Ditch, MD DATE OF BIRTH:  06-28-1961  DATE OF ADMISSION:  06/01/2011 DATE OF DISCHARGE:  06/15/2011                              DISCHARGE SUMMARY   HISTORY OF PRESENT ILLNESS:  Please see the initial psych assessment for details of admission.  Briefly, a 50 year old [place] American female with a history of schizophrenia paranoid type along with cocaine dependence, admitted because of suicidal ideations and verbalizing hearing voices.  The patient at the time of admission was disorganized and paranoid.  HOSPITAL COURSE:  During the hospital stay the patient was continued on her regular combination of Haldol 10 mg at bedtime along with lithium 300 mg at bedtime.  The patient was given Haldol Decanoate 100 mg on August 24, which is due every 4 weeks.  The patient responded to the combination well without any side effects.  Treatment team determined that the patient needs to be in a structured environment outside, so she was referred to assisted living facility. Once the placement was found for the patient, the patient was stable to be discharged.  On June 15, 2011, the patient seen in the treatment team.  She was very logical and goal-directed, not hallucinating or delusional, not suicidal or homicidal, denied any side effects from the medications.  Psychoeducation given to the patient regarding the cocaine abuse.  DIAGNOSIS AT THE TIME OF DISCHARGE:  Axis I:  Chronic schizophrenia, paranoid type.  Chronic cocaine dependence. Axis II:  Deferred. Axis III:  No active medical issue. Axis IV:  Chronic substance abuse and mental health issues. Axis V:  55.  DISCHARGE MEDICATIONS: 1. Cogentin 1 mg 1-1/2 tablet by mouth daily. 2. Haldol 10 mg p.o. daily. 3. Lithium 300 mg daily at bedtime. 4.  Trazodone 100 mg at bedtime,/ 5. Haldol Decanoate given on August 24, is due every 4 weeks.  DISCHARGE FOLLOWUP:  The patient will follow up with [facility], phone number [phone].     Eulogio Ditch, MD     SA/MEDQ  D:  06/16/2011  T:  06/16/2011  Job:  [id number]  Electronically Signed by Eulogio Ditch  on 06/16/2011 03:01:25 PM

## 2011-07-18 ENCOUNTER — Emergency Department (HOSPITAL_COMMUNITY)
Admission: EM | Admit: 2011-07-18 | Discharge: 2011-07-18 | Discharge status: Against Medical Advice | Payer: Medicaid Other | Attending: Emergency Medicine | Admitting: Emergency Medicine

## 2011-07-18 DIAGNOSIS — R6889 Other general symptoms and signs: Secondary | ICD-10-CM | POA: Insufficient documentation

## 2011-07-18 DIAGNOSIS — R52 Pain, unspecified: Secondary | ICD-10-CM | POA: Insufficient documentation

## 2011-09-16 ENCOUNTER — Emergency Department (HOSPITAL_COMMUNITY): Payer: Medicaid Other

## 2011-09-16 ENCOUNTER — Encounter: Payer: Self-pay | Admitting: Emergency Medicine

## 2011-09-16 ENCOUNTER — Emergency Department (HOSPITAL_COMMUNITY)
Admission: EM | Admit: 2011-09-16 | Discharge: 2011-09-16 | Disposition: A | Discharge status: Home / Self Care | Payer: Medicaid Other | Attending: Emergency Medicine | Admitting: Emergency Medicine

## 2011-09-16 DIAGNOSIS — M79609 Pain in unspecified limb: Secondary | ICD-10-CM | POA: Insufficient documentation

## 2011-09-16 DIAGNOSIS — S40029A Contusion of unspecified upper arm, initial encounter: Secondary | ICD-10-CM | POA: Insufficient documentation

## 2011-09-16 DIAGNOSIS — S40022A Contusion of left upper arm, initial encounter: Secondary | ICD-10-CM

## 2011-09-16 DIAGNOSIS — S8012XA Contusion of left lower leg, initial encounter: Secondary | ICD-10-CM

## 2011-09-16 DIAGNOSIS — S8010XA Contusion of unspecified lower leg, initial encounter: Secondary | ICD-10-CM | POA: Insufficient documentation

## 2011-09-16 MED ORDER — TETANUS-DIPHTH-ACELL PERTUSSIS 5-2.5-18.5 LF-MCG/0.5 IM SUSP
0.5000 mL | Freq: Once | INTRAMUSCULAR | Status: AC
  Administered 2011-09-16: 0.5 mL via INTRAMUSCULAR
  Filled 2011-09-16: qty 0.5
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
## 2011-09-16 NOTE — ED Notes (Signed)
Spoke to  Radiology.  Will be coming to do portable xray with in 

## 2011-09-16 NOTE — ED Notes (Addendum)
Radiology unable to obtain xray.  Pt refused to lay on her back and refused to cooperate with tech.  Pt refused to wake up and was returned to ED until pt able to cooperate with xray staff.  Radiology will hold orders until pt cooperative

## 2011-09-16 NOTE — ED Notes (Signed)
Pt called sister and sister agreed to come pick her up.
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
## 2011-09-17 NOTE — ED Provider Notes (Signed)
I saw and evaluated the patient, reviewed the resident's note and I agree with the findings and plan. Extremity complaints. Negative x-rays. Discharged home  Juliet Rude. Rubin Payor, MD 09/17/11 1949

## 2012-09-07 ENCOUNTER — Emergency Department (HOSPITAL_COMMUNITY)
Admission: EM | Admit: 2012-09-07 | Discharge: 2012-09-08 | Disposition: A | Discharge status: Home / Self Care | Payer: Medicaid Other | Attending: Emergency Medicine | Admitting: Emergency Medicine

## 2012-09-07 ENCOUNTER — Encounter (HOSPITAL_COMMUNITY): Payer: Self-pay

## 2012-09-07 ENCOUNTER — Emergency Department (HOSPITAL_COMMUNITY): Payer: Medicaid Other

## 2012-09-07 DIAGNOSIS — F1910 Other psychoactive substance abuse, uncomplicated: Secondary | ICD-10-CM

## 2012-09-07 DIAGNOSIS — F1410 Cocaine abuse, uncomplicated: Secondary | ICD-10-CM | POA: Insufficient documentation

## 2012-09-07 DIAGNOSIS — F17200 Nicotine dependence, unspecified, uncomplicated: Secondary | ICD-10-CM | POA: Insufficient documentation

## 2012-09-07 DIAGNOSIS — F209 Schizophrenia, unspecified: Secondary | ICD-10-CM

## 2012-09-07 HISTORY — DX: Schizophrenia, unspecified: F20.9

## 2012-09-07 LAB — RAPID URINE DRUG SCREEN, HOSP PERFORMED
Cocaine: POSITIVE — AB
Opiates: NOT DETECTED
Tetrahydrocannabinol: NOT DETECTED

## 2012-09-07 LAB — CBC WITH DIFFERENTIAL/PLATELET
Basophils Relative: 1 % (ref 0–1)
Eosinophils Absolute: 0.1 10*3/uL (ref 0.0–0.7)
Eosinophils Relative: 1 % (ref 0–5)
HCT: 39.8 % (ref 36.0–46.0)
Hemoglobin: 13.5 g/dL (ref 12.0–15.0)
MCH: 30.6 pg (ref 26.0–34.0)
MCHC: 33.9 g/dL (ref 30.0–36.0)
MCV: 90.2 fL (ref 78.0–100.0)
Monocytes Absolute: 1.1 10*3/uL — ABNORMAL HIGH (ref 0.1–1.0)
Monocytes Relative: 8 % (ref 3–12)

## 2012-09-07 LAB — URINE MICROSCOPIC-ADD ON

## 2012-09-07 LAB — SALICYLATE LEVEL: Salicylate Lvl: <2 mg/dL — ABNORMAL LOW (ref 2.8–20.0)

## 2012-09-07 LAB — COMPREHENSIVE METABOLIC PANEL
Albumin: 4 g/dL (ref 3.5–5.2)
BUN: 14 mg/dL (ref 6–23)
Calcium: 9.8 mg/dL (ref 8.4–10.5)
Chloride: 104 mEq/L (ref 96–112)
Creatinine, Ser: 0.87 mg/dL (ref 0.50–1.10)
Total Bilirubin: 0.3 mg/dL (ref 0.3–1.2)

## 2012-09-07 LAB — URINALYSIS, ROUTINE W REFLEX MICROSCOPIC
Glucose, UA: NEGATIVE mg/dL
Leukocytes, UA: NEGATIVE
Protein, ur: 30 mg/dL — AB
Specific Gravity, Urine: 1.035 — ABNORMAL HIGH (ref 1.005–1.030)

## 2012-09-07 LAB — ACETAMINOPHEN LEVEL: Acetaminophen (Tylenol), Serum: <15 ug/mL (ref 10–30)

## 2012-09-07 MED ORDER — ZIPRASIDONE HCL 20 MG PO CAPS: 20.0000 mg | ORAL_CAPSULE | Freq: Two times a day (BID) | ORAL | Status: DC

## 2012-09-07 MED ORDER — NICOTINE 21 MG/24HR TD PT24: 21.0000 mg | MEDICATED_PATCH | Freq: Every day | TRANSDERMAL | Status: DC

## 2012-09-07 MED ORDER — ZOLPIDEM TARTRATE 5 MG PO TABS: 5.0000 mg | ORAL_TABLET | Freq: Every evening | ORAL | Status: DC | PRN

## 2012-09-07 MED ORDER — ZIPRASIDONE MESYLATE 20 MG IM SOLR
10.0000 mg | Freq: Once | INTRAMUSCULAR | Status: AC
  Administered 2012-09-07: 10 mg via INTRAMUSCULAR
  Filled 2012-09-07: qty 20

## 2012-09-07 MED ORDER — LORAZEPAM 1 MG PO TABS: 1.0000 mg | ORAL_TABLET | Freq: Three times a day (TID) | ORAL | Status: DC | PRN

## 2012-09-07 MED ORDER — ONDANSETRON HCL 4 MG PO TABS: 4.0000 mg | ORAL_TABLET | Freq: Three times a day (TID) | ORAL | Status: DC | PRN

## 2012-09-07 NOTE — ED Provider Notes (Addendum)
History     CSN: [id number]  Arrival date & time 09/07/12  1522   First MD Initiated Contact with Patient 09/07/12 1536    level 5  Chief Complaint  Patient presents with  . Medical Clearance    (Consider location/radiation/quality/duration/timing/severity/associated sxs/prior treatment) HPI Patient brought in by gpd found [place] middle of busy road.  Reports from gpd that she states she has been hearing voices but she does not tell me this.  She is intermittently speaking with me and then doesn't answer questions.  Police officer states she said she wanted to detox and then patient states to me she has been doing cocaine and drinking but hasn't been since yesterday.   Past Medical History  Diagnosis Date  . Schizophrenia     Past Surgical History  Procedure Date  . Abdominal surgery     History reviewed. No pertinent family history.  History  Substance Use Topics  . Smoking status: Current Every Day Smoker -- 2.0 packs/day  . Smokeless tobacco: Not on file  . Alcohol Use: Yes     Comment: patient drinks beer and wine daily    OB History    Grav Para Term Preterm Abortions TAB SAB Ect Mult Living                  Review of Systems  Unable to perform ROS   Allergies  Review of patient's allergies indicates no known allergies.  Home Medications  No current outpatient prescriptions on file.  BP 142/76  Pulse 78  Temp 98.8 F (37.1 C)  Resp 16  SpO2 99%  LMP 09/07/2012  Physical Exam  Nursing note and vitals reviewed. Constitutional: She is oriented to person, place, and time. She appears well-developed and well-nourished.       unkempt  HENT:  Head: Normocephalic and atraumatic.  Right Ear: External ear normal.  Left Ear: External ear normal.  Mouth/Throat: Oropharynx is clear and moist.       Poor dentition  Eyes: EOM are normal. Pupils are equal, round, and reactive to light.  Neck: Normal range of motion. Neck supple.  Cardiovascular: Normal rate,  regular rhythm and normal heart sounds.   Pulmonary/Chest: Effort normal and breath sounds normal.  Abdominal: Soft. Bowel sounds are normal.  Musculoskeletal: Normal range of motion.  Neurological: She is alert and oriented to person, place, and time.  Psychiatric: Her speech is normal. Her affect is blunt and inappropriate. She is withdrawn. Thought content is paranoid.       Patient only intermittently cooperative with exam    ED Course  Procedures (including critical care time)  Labs Reviewed  CBC WITH DIFFERENTIAL - Abnormal; Notable for the following:    WBC 12.8 (*)     Monocytes Absolute 1.1 (*)     All other components within normal limits  URINE RAPID DRUG SCREEN (HOSP PERFORMED) - Abnormal; Notable for the following:    Cocaine POSITIVE (*)     All other components within normal limits  URINALYSIS, ROUTINE W REFLEX MICROSCOPIC - Abnormal; Notable for the following:    APPearance CLOUDY (*)     Specific Gravity, Urine 1.035 (*)     Ketones, ur TRACE (*)     Protein, ur 30 (*)     All other components within normal limits  URINE MICROSCOPIC-ADD ON  COMPREHENSIVE METABOLIC PANEL  ETHANOL  SALICYLATE LEVEL  ACETAMINOPHEN LEVEL   No results found.   No diagnosis found.  MDM   Patient positive for cocaine.  She has a history of schizophrenia and is hearing voices, unclear medication compliance.   Holding orders entered and patient to be assessed by ACT   Hilario Quarry, MD 09/07/12 4540  Hilario Quarry, MD 09/07/12 2227

## 2012-09-07 NOTE — ED Notes (Signed)
Pt. Belongs placed in locker #34
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
## 2012-09-07 NOTE — ED Notes (Signed)
Pt awake laying in the bed watching TV

## 2012-09-08 ENCOUNTER — Encounter (HOSPITAL_COMMUNITY): Payer: Self-pay | Admitting: *Deleted

## 2012-09-08 NOTE — ED Provider Notes (Signed)
  Physical Exam  BP 149/86  Pulse 60  Temp 98.5 F (36.9 C) (Oral)  Resp 18  SpO2 99%  LMP 09/07/2012  Physical Exam  ED Course  Procedures  MDM [facility] has evaluated the patient, she refuses drug treatment at this time, she is not a risk to danger of her self or others, she can be safely discharged.      Vida Roller, MD 09/08/12 [phone]

## 2012-09-08 NOTE — ED Notes (Signed)
Patient discharge via ambulatory with a steady gait. Respirations equal and unlabored. Skin warm and dry. No acute distress noted. 
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
## 2012-09-08 NOTE — ED Notes (Signed)
Telepsych in progress, tech and CSW outside room

## 2012-09-08 NOTE — ED Notes (Signed)
The patient is a 51 year old female who presents voluntarily to emergency department. Patient is homeless. Patient reports that she is suicidal with a plan to lie down in the middle of the road. Patient also says she is hearing voices with commands to harm self and others. Patient is abusing crack cocaine and alcohol. Patient admits she "uses a lot of cocaine" and funds her habit by pan handling. Patient told Act team member Aurther Loft that her sister wants her to prostitute but she refuses. Patient says alcohol intake in approximately 2- 40's daily and the amount of crack cocaine varies. Patient reports that she last used crack cocaine 1 week ago. Patient says she doesn't remember the last time alcohol was used.

## 2012-09-11 ENCOUNTER — Encounter (HOSPITAL_COMMUNITY): Payer: Self-pay | Admitting: *Deleted

## 2012-09-11 ENCOUNTER — Emergency Department (HOSPITAL_COMMUNITY)
Admission: EM | Admit: 2012-09-11 | Discharge: 2012-09-12 | Disposition: A | Discharge status: Home / Self Care | Payer: Medicaid Other | Attending: Emergency Medicine | Admitting: Emergency Medicine

## 2012-09-11 DIAGNOSIS — F209 Schizophrenia, unspecified: Secondary | ICD-10-CM | POA: Insufficient documentation

## 2012-09-11 DIAGNOSIS — Z79899 Other long term (current) drug therapy: Secondary | ICD-10-CM | POA: Insufficient documentation

## 2012-09-11 DIAGNOSIS — F411 Generalized anxiety disorder: Secondary | ICD-10-CM | POA: Insufficient documentation

## 2012-09-11 DIAGNOSIS — F17200 Nicotine dependence, unspecified, uncomplicated: Secondary | ICD-10-CM | POA: Insufficient documentation
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
## 2012-09-12 NOTE — ED Provider Notes (Signed)
Medical screening examination/treatment/procedure(s) were performed by non-physician practitioner and as supervising physician I was immediately available for consultation/collaboration.   Jehieli Brassell, MD 09/12/12 1653 
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
## 2012-09-12 NOTE — ED Provider Notes (Signed)
Medical screening examination/treatment/procedure(s) were performed by non-physician practitioner and as supervising physician I was immediately available for consultation/collaboration.    Nelia Shi, MD 09/12/12 [phone]
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
## 2012-09-12 NOTE — ED Notes (Signed)
Pt stating "I don't want to go out in the cold".

## 2012-09-12 NOTE — ED Notes (Signed)
Social Work at bedside 

## 2012-09-12 NOTE — ED Notes (Signed)
ACT team at bedside.  

## 2014-01-26 ENCOUNTER — Encounter (HOSPITAL_COMMUNITY): Payer: Self-pay | Admitting: Emergency Medicine

## 2014-01-26 ENCOUNTER — Emergency Department (HOSPITAL_COMMUNITY)
Admission: EM | Admit: 2014-01-26 | Discharge: 2014-01-29 | Disposition: A | Discharge status: Home / Self Care | Payer: Medicaid Other | Attending: Emergency Medicine | Admitting: Emergency Medicine

## 2014-01-26 DIAGNOSIS — F17200 Nicotine dependence, unspecified, uncomplicated: Secondary | ICD-10-CM | POA: Insufficient documentation

## 2014-01-26 DIAGNOSIS — F1910 Other psychoactive substance abuse, uncomplicated: Secondary | ICD-10-CM | POA: Diagnosis present

## 2014-01-26 DIAGNOSIS — F1410 Cocaine abuse, uncomplicated: Secondary | ICD-10-CM | POA: Insufficient documentation

## 2014-01-26 DIAGNOSIS — F209 Schizophrenia, unspecified: Secondary | ICD-10-CM | POA: Diagnosis present

## 2014-01-26 DIAGNOSIS — F29 Unspecified psychosis not due to a substance or known physiological condition: Secondary | ICD-10-CM | POA: Diagnosis present

## 2014-01-26 LAB — RAPID URINE DRUG SCREEN, HOSP PERFORMED
AMPHETAMINES: NOT DETECTED
BENZODIAZEPINES: NOT DETECTED
Barbiturates: NOT DETECTED
Cocaine: POSITIVE — AB
Opiates: NOT DETECTED
TETRAHYDROCANNABINOL: NOT DETECTED

## 2014-01-26 LAB — CBC WITH DIFFERENTIAL/PLATELET
Basophils Absolute: 0 10*3/uL (ref 0.0–0.1)
Basophils Relative: 0 % (ref 0–1)
EOS ABS: 0 10*3/uL (ref 0.0–0.7)
Eosinophils Relative: 0 % (ref 0–5)
HCT: 36.2 % (ref 36.0–46.0)
HEMOGLOBIN: 12.4 g/dL (ref 12.0–15.0)
LYMPHS ABS: 3.2 10*3/uL (ref 0.7–4.0)
LYMPHS PCT: 24 % (ref 12–46)
MCH: 30.8 pg (ref 26.0–34.0)
MCHC: 34.3 g/dL (ref 30.0–36.0)
MCV: 90 fL (ref 78.0–100.0)
MONOS PCT: 7 % (ref 3–12)
Monocytes Absolute: 0.9 10*3/uL (ref 0.1–1.0)
NEUTROS ABS: 9.1 10*3/uL — AB (ref 1.7–7.7)
NEUTROS PCT: 69 % (ref 43–77)
PLATELETS: 291 10*3/uL (ref 150–400)
RBC: 4.02 MIL/uL (ref 3.87–5.11)
RDW: 13.5 % (ref 11.5–15.5)
WBC: 13.2 10*3/uL — AB (ref 4.0–10.5)

## 2014-01-26 LAB — COMPREHENSIVE METABOLIC PANEL
ALK PHOS: 72 U/L (ref 39–117)
ALT: 12 U/L (ref 0–35)
AST: 16 U/L (ref 0–37)
Albumin: 4.1 g/dL (ref 3.5–5.2)
BILIRUBIN TOTAL: 0.5 mg/dL (ref 0.3–1.2)
BUN: 10 mg/dL (ref 6–23)
CHLORIDE: 98 meq/L (ref 96–112)
CO2: 26 meq/L (ref 19–32)
Calcium: 10 mg/dL (ref 8.4–10.5)
Creatinine, Ser: 0.64 mg/dL (ref 0.50–1.10)
GLUCOSE: 117 mg/dL — AB (ref 70–99)
POTASSIUM: 3.8 meq/L (ref 3.7–5.3)
SODIUM: 137 meq/L (ref 137–147)
Total Protein: 8.1 g/dL (ref 6.0–8.3)

## 2014-01-26 LAB — URINALYSIS, ROUTINE W REFLEX MICROSCOPIC
BILIRUBIN URINE: NEGATIVE
Glucose, UA: NEGATIVE mg/dL
Hgb urine dipstick: NEGATIVE
KETONES UR: NEGATIVE mg/dL
LEUKOCYTES UA: NEGATIVE
NITRITE: NEGATIVE
PH: 6 (ref 5.0–8.0)
PROTEIN: NEGATIVE mg/dL
Specific Gravity, Urine: 1.008 (ref 1.005–1.030)
Urobilinogen, UA: 0.2 mg/dL (ref 0.0–1.0)

## 2014-01-26 LAB — ACETAMINOPHEN LEVEL: Acetaminophen (Tylenol), Serum: <15 ug/mL (ref 10–30)

## 2014-01-26 LAB — SALICYLATE LEVEL

## 2014-01-26 LAB — ETHANOL

## 2014-01-26 LAB — LIPASE, BLOOD: Lipase: 31 U/L (ref 11–59)

## 2014-01-26 MED ORDER — IBUPROFEN 200 MG PO TABS: 600.0000 mg | ORAL_TABLET | Freq: Three times a day (TID) | ORAL | Status: DC | PRN

## 2014-01-26 MED ORDER — ONDANSETRON HCL 4 MG PO TABS
4.0000 mg | ORAL_TABLET | Freq: Three times a day (TID) | ORAL | Status: DC | PRN
  Administered 2014-01-26 – 2014-01-28 (×3): 4 mg via ORAL
  Filled 2014-01-26 (×3): qty 1

## 2014-01-26 MED ORDER — ALUM & MAG HYDROXIDE-SIMETH 200-200-20 MG/5ML PO SUSP: 30.0000 mL | ORAL | Status: DC | PRN

## 2014-01-26 MED ORDER — QUETIAPINE FUMARATE 25 MG PO TABS
25.0000 mg | ORAL_TABLET | Freq: Two times a day (BID) | ORAL | Status: DC
  Administered 2014-01-27 – 2014-01-28 (×3): 25 mg via ORAL
  Filled 2014-01-26 (×5): qty 1

## 2014-01-26 MED ORDER — ACETAMINOPHEN 325 MG PO TABS: 650.0000 mg | ORAL_TABLET | ORAL | Status: DC | PRN

## 2014-01-26 MED ORDER — LORAZEPAM 1 MG PO TABS
2.0000 mg | ORAL_TABLET | Freq: Once | ORAL | Status: AC
  Administered 2014-01-26: 2 mg via ORAL
  Filled 2014-01-26: qty 2

## 2014-01-26 MED ORDER — QUETIAPINE FUMARATE 50 MG PO TABS
50.0000 mg | ORAL_TABLET | Freq: Every day | ORAL | Status: DC
  Administered 2014-01-26 – 2014-01-27 (×2): 50 mg via ORAL
  Filled 2014-01-26 (×2): qty 1

## 2014-01-26 NOTE — ED Notes (Signed)
TTS into see 
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
## 2014-01-26 NOTE — ED Notes (Signed)
Pt transferred from Main ed, presents for medical clearance.  Pt reports SI with plan to lay in floor and cut herself.  SI in the past, not too long ago, pt reports. Admits to HI, wants to hurt everybody and AV hallucinations, saying hurt myself.  Pt states she used crack cocaine yesterday and drinks a lot of alcohol.  Feeling hopeless. Diag with Schizophrenia in the past, reports she took her meds yesterday.  Pt calm, not forthcomimg with any more information.
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
## 2014-01-26 NOTE — Progress Notes (Signed)
[facility]: Gunnar Fusiaula called @2245  requesting IVC paperwork to be faxed to their facility. RN was called and informed to fax paperwork to Disposition tech so [facility].  [facility]: @2300  IVC paperwork received/faxed  Deatra JamesKeDra Cathey Disposition [facility]

## 2014-01-26 NOTE — ED Notes (Signed)
Bed: [id number] Expected date:  Expected time:  Means of arrival:  Comments: F Abd pain

## 2014-01-26 NOTE — ED Provider Notes (Signed)
CSN: [id number]     Arrival date & time 01/26/14  0341 History   First MD Initiated Contact with Patient 01/26/14 0404     Chief Complaint  Patient presents with  . Abdominal Pain     (Consider location/radiation/quality/duration/timing/severity/associated sxs/prior Treatment) HPI Patient with history of schizophrenia and substance abuse presents to the EMS complaining of abdominal pain and one to be taken to the hospital. At this time she is not answering any questions. Level V caveat applies. Past Medical History  Diagnosis Date  . Schizophrenia    Past Surgical History  Procedure Laterality Date  . Abdominal surgery     History reviewed. No pertinent family history. History  Substance Use Topics  . Smoking status: Current Every Day Smoker -- 2.00 packs/day  . Smokeless tobacco: Not on file  . Alcohol Use: Yes     Comment: patient drinks beer and wine daily   OB History   Grav Para Term Preterm Abortions TAB SAB Ect Mult Living                 Review of Systems  Unable to perform ROS: Psychiatric disorder      Allergies  Review of patient's allergies indicates no known allergies.  Home Medications   Prior to Admission medications   Medication Sig Start Date End Date Taking? Authorizing Provider  haloperidol lactate (HALDOL) 5 MG/ML injection 5 mg every 30 (thirty) days.    Historical Provider, MD   BP 122/81  Pulse 75  Resp 18  SpO2 93% Physical Exam  Nursing note and vitals reviewed. Constitutional: She appears well-developed and well-nourished. No distress.  Awake and alert. Making eye contact.  HENT:  Head: Normocephalic and atraumatic.  Mouth/Throat: Oropharynx is clear and moist.  No obvious injury.  Eyes: EOM are normal. Pupils are equal, round, and reactive to light.  Neck: Normal range of motion. Neck supple.  Cardiovascular: Normal rate and regular rhythm.   Pulmonary/Chest: Effort normal and breath sounds normal. No respiratory distress. She  has no wheezes. She has no rales.  Abdominal: Soft. Bowel sounds are normal. She exhibits no distension and no mass. There is no tenderness. There is no rebound and no guarding.  Musculoskeletal: Normal range of motion. She exhibits no edema and no tenderness.  Neurological: She is alert.  Observed moving all extremities. Patient is ambulatory.  Skin: Skin is warm and dry. No rash noted. No erythema.  Psychiatric:  Bizarre behavior. Voluntary mutism    ED Course  Procedures (including critical care time) Labs Review Labs Reviewed  CBC WITH DIFFERENTIAL - Abnormal; Notable for the following:    WBC 13.2 (*)    Neutro Abs 9.1 (*)    All other components within normal limits  COMPREHENSIVE METABOLIC PANEL - Abnormal; Notable for the following:    Glucose, Bld 117 (*)    All other components within normal limits  SALICYLATE LEVEL - Abnormal; Notable for the following:    Salicylate Lvl <2.0 (*)    All other components within normal limits  LIPASE, BLOOD  ACETAMINOPHEN LEVEL  ETHANOL  URINALYSIS, ROUTINE W REFLEX MICROSCOPIC  URINE RAPID DRUG SCREEN (HOSP PERFORMED)    Imaging Review No results found.   EKG Interpretation None      MDM   Final diagnoses:  None   Will have psychiatry evaluate patient. Patient's abdomen is benign. She has mild elevation in white blood cell count which was present on prior laboratory studies. Her vital signs are  stable.  Signed out to oncoming emergency physician pending psychiatric evaluation.  Loren Raceravid Jatinder Mcdonagh, MD 01/27/14 [phone]

## 2014-01-26 NOTE — ED Notes (Signed)
sleeping

## 2014-01-26 NOTE — Consult Note (Signed)
[facility] Face-to-Face Psychiatry Consult   Reason for Consult:  Psychosis Referring Physician:  ED MD  [name] is an 53 y.o. female. 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 Schizophrenia    AXIS IV:  economic problems, other psychosocial or environmental problems, problems related to social environment and problems with primary support group AXIS V:  21-30 behavior considerably influenced by delusions or hallucinations OR serious impairment in judgment, communication OR inability to function in almost all areas  Plan:  Recommend psychiatric Inpatient admission when medically cleared.  Dr. Harrington Challenger assessed the patient and concurs with the plan to admit the patient to inpatient psychiatry.  Subjective:   [name] is a 53 y.o. female patient admitted with psychosis.  HPI:  Patient is paranoid, concerned that we want her SSN and that someone is trying to kill her.  Relates recent history of significant drug and alcohol use "i buy a whole lot of drugs and I drink a whole lot of Alcohol."  Patient endorses SI with plan, states she tried to lay down in the middle of the road to kill herself yesterday.  Patient endorses command auditory hallucination with plan to harm self.  HI without plan.  Patient has been out on unit, noted to be agitated at times.  Given PRN Ativan 59m for agitation.  Speech is disorganized and patient does appear to attend to internal stimuli.   HPI Elements:   Location:  generalized. Quality:  acute. Severity:  severe. Timing:  constant. Duration:  few weeks. Context:  stressors, drug use.  Past Psychiatric History: Past Medical History  Diagnosis Date  . Schizophrenia     reports that she has been smoking.  She does not have any smokeless tobacco history on file. She reports that she  drinks alcohol. She reports that she uses illicit drugs ("Crack" cocaine). History reviewed. No pertinent family history. Family History Substance Abuse: No Family Supports: No Living Arrangements: Alone Can pt return to current living arrangement?: Yes Abuse/Neglect ([facility] Physical Abuse: Denies Verbal Abuse: Denies Sexual Abuse: Denies Allergies:  No Known Allergies  ACT Assessment Complete:  Yes:    Educational Status    Risk to Self: Risk to self Suicidal Ideation: Yes-Currently Present Suicidal Intent: Yes-Currently Present Is patient at risk for suicide?: Yes Suicidal Plan?: Yes-Currently Present Specify Current Suicidal Plan: walk into traffic Access to Means: Yes Specify Access to Suicidal Means: can find traffic What has been your use of drugs/alcohol within the last 12 months?: cocaine Previous Attempts/Gestures: Yes How many times?: 1 Other Self Harm Risks: na Triggers for Past Attempts: Unpredictable Intentional Self Injurious Behavior: None Family Suicide History: No Recent stressful life event(s): Other (Comment) (SA issues) Persecutory voices/beliefs?: Yes Depression: Yes Depression Symptoms: Despondent;Tearfulness;Isolating;Fatigue;Guilt;Loss of interest in usual pleasures;Feeling worthless/self pity;Feeling angry/irritable Substance abuse history and/or treatment for substance abuse?: Yes Suicide prevention information given to non-admitted patients: Not applicable  Risk to Others: Risk to Others Homicidal Ideation: No Thoughts of Harm to Others: No Current Homicidal Intent: No Current Homicidal Plan: No Access to Homicidal Means: No Identified Victim: na History of harm to others?: No Assessment of Violence: None Noted Violent Behavior Description: cooperative Does patient have access to weapons?: No Criminal Charges Pending?: No Does patient have a court date: No  Abuse: Abuse/Neglect Assessment (Assessment to be  complete while patient is  alone) Physical Abuse: Denies Verbal Abuse: Denies Sexual Abuse: Denies Exploitation of patient/patient's resources: Denies  Prior Inpatient Therapy: Prior Inpatient Therapy Prior Inpatient Therapy: No Prior Therapy Dates: na Prior Therapy Facilty/Provider(s): na Reason for Treatment: na  Prior Outpatient Therapy: Prior Outpatient Therapy Prior Outpatient Therapy: No Prior Therapy Dates: na Prior Therapy Facilty/Provider(s): na Reason for Treatment: na  Additional Information: Additional Information 1:1 In Past 12 Months?: No CIRT Risk: No Elopement Risk: No Does patient have medical clearance?: Yes                  Objective: Blood pressure 158/78, pulse 64, temperature 97.9 F (36.6 C), temperature source Oral, resp. rate 16, SpO2 100.00%.There is no height or weight on file to calculate BMI. Results for orders placed during the hospital encounter of 01/26/14 (from the past 72 hour(s))  CBC WITH DIFFERENTIAL     Status: Abnormal   Collection Time    01/26/14  4:25 AM      Result Value Ref Range   WBC 13.2 (*) 4.0 - 10.5 K/uL   RBC 4.02  3.87 - 5.11 MIL/uL   Hemoglobin 12.4  12.0 - 15.0 g/dL   HCT 36.2  36.0 - 46.0 %   MCV 90.0  78.0 - 100.0 fL   MCH 30.8  26.0 - 34.0 pg   MCHC 34.3  30.0 - 36.0 g/dL   RDW 13.5  11.5 - 15.5 %   Platelets 291  150 - 400 K/uL   Neutrophils Relative % 69  43 - 77 %   Neutro Abs 9.1 (*) 1.7 - 7.7 K/uL   Lymphocytes Relative 24  12 - 46 %   Lymphs Abs 3.2  0.7 - 4.0 K/uL   Monocytes Relative 7  3 - 12 %   Monocytes Absolute 0.9  0.1 - 1.0 K/uL   Eosinophils Relative 0  0 - 5 %   Eosinophils Absolute 0.0  0.0 - 0.7 K/uL   Basophils Relative 0  0 - 1 %   Basophils Absolute 0.0  0.0 - 0.1 K/uL  COMPREHENSIVE METABOLIC PANEL     Status: Abnormal   Collection Time    01/26/14  4:25 AM      Result Value Ref Range   Sodium 137  137 - 147 mEq/L   Potassium 3.8  3.7 - 5.3 mEq/L   Chloride 98  96 - 112 mEq/L   CO2 26  19 - 32  mEq/L   Glucose, Bld 117 (*) 70 - 99 mg/dL   BUN 10  6 - 23 mg/dL   Creatinine, Ser 0.64  0.50 - 1.10 mg/dL   Calcium 10.0  8.4 - 10.5 mg/dL   Total Protein 8.1  6.0 - 8.3 g/dL   Albumin 4.1  3.5 - 5.2 g/dL   AST 16  0 - 37 U/L   ALT 12  0 - 35 U/L   Alkaline Phosphatase 72  39 - 117 U/L   Total Bilirubin 0.5  0.3 - 1.2 mg/dL   GFR calc non Af Amer >90  >90 mL/min   GFR calc Af Amer >90  >90 mL/min   Comment: (NOTE)     The eGFR has been calculated using the CKD EPI equation.     This calculation has not been validated in all clinical situations.     eGFR's persistently <90 mL/min signify possible Chronic Kidney     Disease.  LIPASE, BLOOD  Status: None   Collection Time    01/26/14  4:25 AM      Result Value Ref Range   Lipase 31  11 - 59 U/L  ACETAMINOPHEN LEVEL     Status: None   Collection Time    01/26/14  4:25 AM      Result Value Ref Range   Acetaminophen (Tylenol), Serum <15.0  10 - 30 ug/mL   Comment:            THERAPEUTIC CONCENTRATIONS VARY     SIGNIFICANTLY. A RANGE OF 10-30     ug/mL MAY BE AN EFFECTIVE     CONCENTRATION FOR MANY PATIENTS.     HOWEVER, SOME ARE BEST TREATED     AT CONCENTRATIONS OUTSIDE THIS     RANGE.     ACETAMINOPHEN CONCENTRATIONS     >150 ug/mL AT 4 HOURS AFTER     INGESTION AND >50 ug/mL AT 12     HOURS AFTER INGESTION ARE     OFTEN ASSOCIATED WITH TOXIC     REACTIONS.  ETHANOL     Status: None   Collection Time    01/26/14  4:25 AM      Result Value Ref Range   Alcohol, Ethyl (B) <11  0 - 11 mg/dL   Comment:            LOWEST DETECTABLE LIMIT FOR     SERUM ALCOHOL IS 11 mg/dL     FOR MEDICAL PURPOSES ONLY  SALICYLATE LEVEL     Status: Abnormal   Collection Time    01/26/14  4:25 AM      Result Value Ref Range   Salicylate Lvl <2.4 (*) 2.8 - 20.0 mg/dL  URINALYSIS, ROUTINE W REFLEX MICROSCOPIC     Status: None   Collection Time    01/26/14  6:10 AM      Result Value Ref Range   Color, Urine YELLOW  YELLOW    APPearance CLEAR  CLEAR   Specific Gravity, Urine 1.008  1.005 - 1.030   pH 6.0  5.0 - 8.0   Glucose, UA NEGATIVE  NEGATIVE mg/dL   Hgb urine dipstick NEGATIVE  NEGATIVE   Bilirubin Urine NEGATIVE  NEGATIVE   Ketones, ur NEGATIVE  NEGATIVE mg/dL   Protein, ur NEGATIVE  NEGATIVE mg/dL   Urobilinogen, UA 0.2  0.0 - 1.0 mg/dL   Nitrite NEGATIVE  NEGATIVE   Leukocytes, UA NEGATIVE  NEGATIVE   Comment: MICROSCOPIC NOT DONE ON URINES WITH NEGATIVE PROTEIN, BLOOD, LEUKOCYTES, NITRITE, OR GLUCOSE <1000 mg/dL.  URINE RAPID DRUG SCREEN (HOSP PERFORMED)     Status: Abnormal   Collection Time    01/26/14  6:10 AM      Result Value Ref Range   Opiates NONE DETECTED  NONE DETECTED   Cocaine POSITIVE (*) NONE DETECTED   Benzodiazepines NONE DETECTED  NONE DETECTED   Amphetamines NONE DETECTED  NONE DETECTED   Tetrahydrocannabinol NONE DETECTED  NONE DETECTED   Barbiturates NONE DETECTED  NONE DETECTED   Comment:            DRUG SCREEN FOR MEDICAL PURPOSES     ONLY.  IF CONFIRMATION IS NEEDED     FOR ANY PURPOSE, NOTIFY LAB     WITHIN 5 DAYS.                LOWEST DETECTABLE LIMITS     FOR URINE DRUG SCREEN     Drug Class  Cutoff (ng/mL)     Amphetamine      1000     Barbiturate      200     Benzodiazepine   384     Tricyclics       536     Opiates          300     Cocaine          300     THC              50   Labs are reviewed and are pertinent for medical issues being addressed.  Current Facility-Administered Medications  Medication Dose Route Frequency Provider Last Rate Last Dose  . acetaminophen (TYLENOL) tablet 650 mg  650 mg Oral Q4H PRN Neta Ehlers, MD      . alum & mag hydroxide-simeth (MAALOX/MYLANTA) 200-200-20 MG/5ML suspension 30 mL  30 mL Oral PRN Neta Ehlers, MD      . ibuprofen (ADVIL,MOTRIN) tablet 600 mg  600 mg Oral Q8H PRN Neta Ehlers, MD      . LORazepam (ATIVAN) tablet 2 mg  2 mg Oral Once Waylan Boga, NP      . ondansetron (ZOFRAN) tablet 4  mg  4 mg Oral Q8H PRN Neta Ehlers, MD      . QUEtiapine (SEROQUEL) tablet 25 mg  25 mg Oral BID Waylan Boga, NP      . QUEtiapine (SEROQUEL) tablet 50 mg  50 mg Oral QHS Waylan Boga, NP       Current Outpatient Prescriptions  Medication Sig Dispense Refill  . haloperidol lactate (HALDOL) 5 MG/ML injection 5 mg every 30 (thirty) days.        Psychiatric Specialty Exam:     Blood pressure 158/78, pulse 64, temperature 97.9 F (36.6 C), temperature source Oral, resp. rate 16, SpO2 100.00%.There is no height or weight on file to calculate BMI.  General Appearance: Disheveled  Eye Sport and exercise psychologist::  Fair  Speech:  slurred due to missing teeth  Volume:  Normal  Mood:  Anxious, Depressed and Irritable  Affect:  Flat  Thought Process:  Disorganized  Orientation:  Full (Time, Place, and Person)  Thought Content:  Delusions, Hallucinations: Auditory Visual and Paranoid Ideation  Suicidal Thoughts:  Yes.  with intent/plan  Homicidal Thoughts:  Yes.  without intent/plan  Memory:  Immediate;   Fair Recent;   Fair Remote;   Fair  Judgement:  Poor  Insight:  Lacking  Psychomotor Activity:  Decreased  Concentration:  Fair  Recall:  [place]: Fair  Akathisia:  No  Handed:  Right  AIMS (if indicated):     Assets:  Resilience  Sleep:      Musculoskeletal: Strength & Muscle Tone: within normal limits Gait & Station: normal Patient leans: N/A  Treatment Plan Summary: Inpatient psychiatry and medication stabilization, Ativan 2 mg once for agitation and Seroquel 25 mg BID and 50 mg at bedtime started.  She said she cannot remember the last time she had haldol,not yesterday.  Waylan Boga, PMH-NP 01/26/2014 2:51 PM Patient seen face to face and I agree with treatment plan Levonne Spiller MD

## 2014-01-26 NOTE — ED Notes (Signed)
MD and Catha NottinghamJamison NP into see

## 2014-01-26 NOTE — Progress Notes (Signed)
Pt's referral faxed to the [facility] facilities with bed availability:  [name], [facility], [facility]- per [name] adult bed available, referral faxed [name] facilities at capacity:  [facility]- per [facility]- per [facility]   Deshaun Weisinger Disposition M[facility]

## 2014-01-26 NOTE — ED Notes (Signed)
Pt BIB EMS, reports that she walked up to the truck and asked to be taken to the hospital for her stomach pain that she has had "for a long time" When pt walked into the ED pt stated that "This is the man's ward. Put me in the women's ward." Pt requesting to leave, will no answer questions or state any complaints.

## 2014-01-26 NOTE — BH Assessment (Signed)
Assessment Note  Danielle SmokerMarie A Waller is an 53 y.o. female.   Pt displays guarded behaviors consistent with paranoia.  Pt reports hearing voices with commands with hallucinations.  Pt also reports "Someone is after me.  I feel safe here.  They can't get me here."  Pt had sheets pulled up to just below her eyes as her eyes darted back and forth.  Pt could not or would not elaborate on who was after her.  Pt reported suicidal intent to end life by walking into traffic.  Pt reports past hx of attempts.  Pt positive for cocaine.  Pt denies HI related intent or ideation.    Pt Ox3, poor eye contact, guarded presentation, cooperative, recited bible scripture, asked TTS to pray for her, speech soft, but clear, disheveled in appearance, bizarre statements.  Pt will be rounded on by psychiatry and they will determine dispo.  Pt appears to be in need of psychiatric inpatient based on TTS assessment.  Axis I: Major Depression, Recurrent severe, Schizoaffective Disorder and Substance Abuse Axis II: Deferred Axis III:  Past Medical History  Diagnosis Date  . Schizophrenia    Axis IV: housing problems, other psychosocial or environmental problems, problems related to social environment and problems with primary support group Axis V: 21-30 behavior considerably influenced by delusions or hallucinations OR serious impairment in judgment, communication OR inability to function in almost all areas  Past Medical History:  Past Medical History  Diagnosis Date  . Schizophrenia     Past Surgical History  Procedure Laterality Date  . Abdominal surgery      Family History: History reviewed. No pertinent family history.  Social History:  reports that she has been smoking.  She does not have any smokeless tobacco history on file. She reports that she drinks alcohol. She reports that she uses illicit drugs ("Crack" cocaine).  Additional Social History:  Alcohol / Drug Use Pain Medications: na Prescriptions:  na Over the Counter: na History of alcohol / drug use?: Yes Longest period of sobriety (when/how long): na Substance #1 Name of Substance 1: cocaine 1 - Age of First Use: 20s 1 - Amount (size/oz): varies 1 - Frequency: varies 1 - Duration: varies 1 - Last Use / Amount: 01-18-14  CIWA: CIWA-Ar BP: 104/70 mmHg Pulse Rate: 70 COWS:    Allergies: No Known Allergies  Home Medications:  (Not in a hospital admission)  OB/GYN Status:  No LMP recorded.  General Assessment Data Location of Assessment: [facility] ED Is this a Tele or Face-to-Face Assessment?: Face-to-Face Is this an Initial Assessment or a Re-assessment for this encounter?: Initial Assessment Living Arrangements: Alone Can pt return to current living arrangement?: Yes Admission Status: Voluntary Is patient capable of signing voluntary admission?: Yes Transfer from: Acute Hospital Referral Source: MD  Medical Screening Exam [facility] Walk-in ONLY) Medical Exam completed: Yes  [facility] Living Arrangements: Alone  Education Status Is patient currently in school?: No Current Grade: na Highest grade of school patient has completed: na Name of school: na Contact person: na  Risk to self Suicidal Ideation: Yes-Currently Present Suicidal Intent: Yes-Currently Present Is patient at risk for suicide?: Yes Suicidal Plan?: Yes-Currently Present Specify Current Suicidal Plan: walk into traffic Access to Means: Yes Specify Access to Suicidal Means: can find traffic What has been your use of drugs/alcohol within the last 12 months?: cocaine Previous Attempts/Gestures: Yes How many times?: 1 Other Self Harm Risks: na Triggers for Past Attempts: Unpredictable Intentional Self Injurious Behavior:  None Family Suicide History: No Recent stressful life event(s): Other (Comment) (SA issues) Persecutory voices/beliefs?: Yes Depression: Yes Depression Symptoms: Despondent;Tearfulness;Isolating;Fatigue;Guilt;Loss of  interest in usual pleasures;Feeling worthless/self pity;Feeling angry/irritable Substance abuse history and/or treatment for substance abuse?: Yes Suicide prevention information given to non-admitted patients: Not applicable  Risk to Others Homicidal Ideation: No Thoughts of Harm to Others: No Current Homicidal Intent: No Current Homicidal Plan: No Access to Homicidal Means: No Identified Victim: na History of harm to others?: No Assessment of Violence: None Noted Violent Behavior Description: cooperative Does patient have access to weapons?: No Criminal Charges Pending?: No Does patient have a court date: No  Psychosis Hallucinations: Auditory;Visual;With command Delusions: Persecutory;Unspecified  Mental Status Report Appear/Hygiene: Bizarre;Disheveled Eye Contact: Poor Motor Activity: Unremarkable Speech: Soft Level of Consciousness: Alert Mood: Depressed;Anxious;Suspicious;Despair;Sad;Worthless, low self-esteem Affect: Anxious;Depressed;Frightened;Sad Anxiety Level: Moderate Thought Processes: Tangential;Relevant Judgement: Impaired Orientation: Person;Place Obsessive Compulsive Thoughts/Behaviors: Minimal  Cognitive Functioning Concentration: Decreased Memory: Recent Impaired;Remote Intact IQ: Average Insight: Poor Impulse Control: Poor Appetite: Fair Weight Loss: 0 Weight Gain: 0 Sleep: Decreased Total Hours of Sleep: 3 Vegetative Symptoms: None  ADLScreening [facility] Assessment Services) Patient's cognitive ability adequate to safely complete daily activities?: Yes Patient able to express need for assistance with ADLs?: Yes Independently performs ADLs?: Yes (appropriate for developmental age)  Prior Inpatient Therapy Prior Inpatient Therapy: No Prior Therapy Dates: na Prior Therapy Facilty/Provider(s): na Reason for Treatment: na  Prior Outpatient Therapy Prior Outpatient Therapy: No Prior Therapy Dates: na Prior Therapy Facilty/Provider(s):  na Reason for Treatment: na  ADL Screening (condition at time of admission) Patient's cognitive ability adequate to safely complete daily activities?: Yes Is the patient deaf or have difficulty hearing?: No Does the patient have difficulty seeing, even when wearing glasses/contacts?: No Does the patient have difficulty concentrating, remembering, or making decisions?: No Patient able to express need for assistance with ADLs?: Yes Does the patient have difficulty dressing or bathing?: No Independently performs ADLs?: Yes (appropriate for developmental age) Does the patient have difficulty walking or climbing stairs?: No Weakness of Legs: None Weakness of Arms/Hands: None  Home Assistive Devices/Equipment Home Assistive Devices/Equipment: None  Therapy Consults (therapy consults require a physician order) PT Evaluation Needed: No OT Evalulation Needed: No SLP Evaluation Needed: No Abuse/Neglect Assessment (Assessment to be complete while patient is alone) Physical Abuse: Denies Verbal Abuse: Denies Sexual Abuse: Denies Exploitation of patient/patient's resources: Denies Values / Beliefs Cultural Requests During Hospitalization: None Spiritual Requests During Hospitalization: None Consults Spiritual Care Consult Needed: No Social Work Consult Needed: No Merchant navy officerAdvance Directives (For Healthcare) Advance Directive: Patient does not have advance directive Pre-existing out of facility DNR order (yellow form or pink MOST form): No    Additional Information 1:1 In Past 12 Months?: No CIRT Risk: No Elopement Risk: No Does patient have medical clearance?: Yes     Disposition:  Disposition Initial Assessment Completed for this Encounter: Yes Disposition of Patient: Inpatient treatment program Type of inpatient treatment program: Adult  On Site Evaluation by:   Reviewed with Physician:    Macon Largeobert Andrew Jadarrius Maselli Jr. 01/26/2014 9:03 AM

## 2014-01-27 DIAGNOSIS — F1910 Other psychoactive substance abuse, uncomplicated: Secondary | ICD-10-CM

## 2014-01-27 DIAGNOSIS — F200 Paranoid schizophrenia: Secondary | ICD-10-CM

## 2014-01-27 DIAGNOSIS — F411 Generalized anxiety disorder: Secondary | ICD-10-CM

## 2014-01-27 MED ORDER — LORAZEPAM 2 MG/ML IJ SOLN: 1.0000 mg | Freq: Four times a day (QID) | INTRAMUSCULAR | Status: DC | PRN

## 2014-01-27 NOTE — ED Notes (Signed)
Pt increasingly agitated, up in hall walking and to room talking about how she hates men. Back to room
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
## 2014-01-27 NOTE — ED Notes (Signed)
Dr Tenny Crawross and NP into see

## 2014-01-27 NOTE — Consult Note (Signed)
[facility] Face-to-Face Psychiatry Consult   Reason for Consult:  Psychosis Referring Physician:  ED MD  [name] is an 53 y.o. female. 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 Schizophrenia    AXIS IV:  economic problems, other psychosocial or environmental problems, problems related to social environment and problems with primary support group AXIS V:  21-30 behavior considerably influenced by delusions or hallucinations OR serious impairment in judgment, communication OR inability to function in almost all areas  Plan:  Recommend psychiatric Inpatient admission when medically cleared.  Dr. Harrington Challenger assessed the patient and concurs with the plan to admit the patient to inpatient psychiatry.  Subjective:   [name] is a 53 y.o. female patient admitted with psychosis.  HPI:  Patient is paranoid, concerned that we want her SSN and that someone is trying to kill her.  Relates recent history of significant drug and alcohol use "i buy a whole lot of drugs and I drink a whole lot of Alcohol."  Patient endorses SI with plan, states she tried to lay down in the middle of the road to kill herself yesterday.  Patient endorses command auditory hallucination with plan to harm self.  HI without plan.  Patient has been out on unit, noted to be agitated at times.  Given PRN Ativan 19m for agitation.  Speech is disorganized and patient does appear to attend to internal stimuli Patient seen today on 01/27/14. She complains of stomach ache but won't give much more information. Still admits to hearing voices and antipsychotic started yesterday.   HPI Elements:   Location:  generalized. Quality:  acute. Severity:  severe. Timing:  constant. Duration:  few weeks. Context:  stressors, drug use.  Past Psychiatric History: Past  Medical History  Diagnosis Date  . Schizophrenia     reports that she has been smoking.  She does not have any smokeless tobacco history on file. She reports that she drinks alcohol. She reports that she uses illicit drugs ("Crack" cocaine). History reviewed. No pertinent family history. Family History Substance Abuse: No Family Supports: No Living Arrangements: Alone Can pt return to current living arrangement?: Yes Abuse/Neglect ([facility] Physical Abuse: Denies Verbal Abuse: Denies Sexual Abuse: Denies Allergies:  No Known Allergies  ACT Assessment Complete:  Yes:    Educational Status    Risk to Self: Risk to self Suicidal Ideation: Yes-Currently Present Suicidal Intent: Yes-Currently Present Is patient at risk for suicide?: Yes Suicidal Plan?: Yes-Currently Present Specify Current Suicidal Plan: walk into traffic Access to Means: Yes Specify Access to Suicidal Means: can find traffic What has been your use of drugs/alcohol within the last 12 months?: cocaine Previous Attempts/Gestures: Yes How many times?: 1 Other Self Harm Risks: na Triggers for Past Attempts: Unpredictable Intentional Self Injurious Behavior: None Family Suicide History: No Recent stressful life event(s): Other (Comment) (SA issues) Persecutory voices/beliefs?: Yes Depression: Yes Depression Symptoms: Despondent;Tearfulness;Isolating;Fatigue;Guilt;Loss of interest in usual pleasures;Feeling worthless/self pity;Feeling angry/irritable Substance abuse history and/or treatment for substance abuse?: Yes Suicide prevention information given to non-admitted patients: Not applicable  Risk to Others: Risk to Others Homicidal Ideation: No Thoughts of Harm to Others: No Current Homicidal Intent: No Current Homicidal Plan: No Access to Homicidal Means: No Identified Victim: na History of harm to others?: No Assessment of Violence: None Noted Violent Behavior Description: cooperative  Does patient have access  to weapons?: No Criminal Charges Pending?: No Does patient have a court date: No  Abuse: Abuse/Neglect Assessment (Assessment to be complete while patient is alone) Physical Abuse: Denies Verbal Abuse: Denies Sexual Abuse: Denies Exploitation of patient/patient's resources: Denies  Prior Inpatient Therapy: Prior Inpatient Therapy Prior Inpatient Therapy: No Prior Therapy Dates: na Prior Therapy Facilty/Provider(s): na Reason for Treatment: na  Prior Outpatient Therapy: Prior Outpatient Therapy Prior Outpatient Therapy: No Prior Therapy Dates: na Prior Therapy Facilty/Provider(s): na Reason for Treatment: na  Additional Information: Additional Information 1:1 In Past 12 Months?: No CIRT Risk: No Elopement Risk: No Does patient have medical clearance?: Yes                  Objective: Blood pressure 132/76, pulse 93, temperature 98 F (36.7 C), temperature source Oral, resp. rate 16, SpO2 99.00%.There is no height or weight on file to calculate BMI. Results for orders placed during the hospital encounter of 01/26/14 (from the past 72 hour(s))  CBC WITH DIFFERENTIAL     Status: Abnormal   Collection Time    01/26/14  4:25 AM      Result Value Ref Range   WBC 13.2 (*) 4.0 - 10.5 K/uL   RBC 4.02  3.87 - 5.11 MIL/uL   Hemoglobin 12.4  12.0 - 15.0 g/dL   HCT 36.2  36.0 - 46.0 %   MCV 90.0  78.0 - 100.0 fL   MCH 30.8  26.0 - 34.0 pg   MCHC 34.3  30.0 - 36.0 g/dL   RDW 13.5  11.5 - 15.5 %   Platelets 291  150 - 400 K/uL   Neutrophils Relative % 69  43 - 77 %   Neutro Abs 9.1 (*) 1.7 - 7.7 K/uL   Lymphocytes Relative 24  12 - 46 %   Lymphs Abs 3.2  0.7 - 4.0 K/uL   Monocytes Relative 7  3 - 12 %   Monocytes Absolute 0.9  0.1 - 1.0 K/uL   Eosinophils Relative 0  0 - 5 %   Eosinophils Absolute 0.0  0.0 - 0.7 K/uL   Basophils Relative 0  0 - 1 %   Basophils Absolute 0.0  0.0 - 0.1 K/uL  COMPREHENSIVE METABOLIC PANEL     Status: Abnormal   Collection Time     01/26/14  4:25 AM      Result Value Ref Range   Sodium 137  137 - 147 mEq/L   Potassium 3.8  3.7 - 5.3 mEq/L   Chloride 98  96 - 112 mEq/L   CO2 26  19 - 32 mEq/L   Glucose, Bld 117 (*) 70 - 99 mg/dL   BUN 10  6 - 23 mg/dL   Creatinine, Ser 0.64  0.50 - 1.10 mg/dL   Calcium 10.0  8.4 - 10.5 mg/dL   Total Protein 8.1  6.0 - 8.3 g/dL   Albumin 4.1  3.5 - 5.2 g/dL   AST 16  0 - 37 U/L   ALT 12  0 - 35 U/L   Alkaline Phosphatase 72  39 - 117 U/L   Total Bilirubin 0.5  0.3 - 1.2 mg/dL   GFR calc non Af Amer >90  >90 mL/min   GFR calc Af Amer >90  >90 mL/min   Comment: (NOTE)     The eGFR has been calculated using the CKD EPI equation.     This calculation has not been validated in all  clinical situations.     eGFR's persistently <90 mL/min signify possible Chronic Kidney     Disease.  LIPASE, BLOOD     Status: None   Collection Time    01/26/14  4:25 AM      Result Value Ref Range   Lipase 31  11 - 59 U/L  ACETAMINOPHEN LEVEL     Status: None   Collection Time    01/26/14  4:25 AM      Result Value Ref Range   Acetaminophen (Tylenol), Serum <15.0  10 - 30 ug/mL   Comment:            THERAPEUTIC CONCENTRATIONS VARY     SIGNIFICANTLY. A RANGE OF 10-30     ug/mL MAY BE AN EFFECTIVE     CONCENTRATION FOR MANY PATIENTS.     HOWEVER, SOME ARE BEST TREATED     AT CONCENTRATIONS OUTSIDE THIS     RANGE.     ACETAMINOPHEN CONCENTRATIONS     >150 ug/mL AT 4 HOURS AFTER     INGESTION AND >50 ug/mL AT 12     HOURS AFTER INGESTION ARE     OFTEN ASSOCIATED WITH TOXIC     REACTIONS.  ETHANOL     Status: None   Collection Time    01/26/14  4:25 AM      Result Value Ref Range   Alcohol, Ethyl (B) <11  0 - 11 mg/dL   Comment:            LOWEST DETECTABLE LIMIT FOR     SERUM ALCOHOL IS 11 mg/dL     FOR MEDICAL PURPOSES ONLY  SALICYLATE LEVEL     Status: Abnormal   Collection Time    01/26/14  4:25 AM      Result Value Ref Range   Salicylate Lvl <1.4 (*) 2.8 - 20.0 mg/dL   URINALYSIS, ROUTINE W REFLEX MICROSCOPIC     Status: None   Collection Time    01/26/14  6:10 AM      Result Value Ref Range   Color, Urine YELLOW  YELLOW   APPearance CLEAR  CLEAR   Specific Gravity, Urine 1.008  1.005 - 1.030   pH 6.0  5.0 - 8.0   Glucose, UA NEGATIVE  NEGATIVE mg/dL   Hgb urine dipstick NEGATIVE  NEGATIVE   Bilirubin Urine NEGATIVE  NEGATIVE   Ketones, ur NEGATIVE  NEGATIVE mg/dL   Protein, ur NEGATIVE  NEGATIVE mg/dL   Urobilinogen, UA 0.2  0.0 - 1.0 mg/dL   Nitrite NEGATIVE  NEGATIVE   Leukocytes, UA NEGATIVE  NEGATIVE   Comment: MICROSCOPIC NOT DONE ON URINES WITH NEGATIVE PROTEIN, BLOOD, LEUKOCYTES, NITRITE, OR GLUCOSE <1000 mg/dL.  URINE RAPID DRUG SCREEN (HOSP PERFORMED)     Status: Abnormal   Collection Time    01/26/14  6:10 AM      Result Value Ref Range   Opiates NONE DETECTED  NONE DETECTED   Cocaine POSITIVE (*) NONE DETECTED   Benzodiazepines NONE DETECTED  NONE DETECTED   Amphetamines NONE DETECTED  NONE DETECTED   Tetrahydrocannabinol NONE DETECTED  NONE DETECTED   Barbiturates NONE DETECTED  NONE DETECTED   Comment:            DRUG SCREEN FOR MEDICAL PURPOSES     ONLY.  IF CONFIRMATION IS NEEDED     FOR ANY PURPOSE, NOTIFY LAB     WITHIN 5 DAYS.  LOWEST DETECTABLE LIMITS     FOR URINE DRUG SCREEN     Drug Class       Cutoff (ng/mL)     Amphetamine      1000     Barbiturate      200     Benzodiazepine   209     Tricyclics       470     Opiates          300     Cocaine          300     THC              50   Labs are reviewed and are pertinent for medical issues being addressed.  Current Facility-Administered Medications  Medication Dose Route Frequency Provider Last Rate Last Dose  . acetaminophen (TYLENOL) tablet 650 mg  650 mg Oral Q4H PRN Neta Ehlers, MD      . alum & mag hydroxide-simeth (MAALOX/MYLANTA) 200-200-20 MG/5ML suspension 30 mL  30 mL Oral PRN Neta Ehlers, MD      . ibuprofen (ADVIL,MOTRIN)  tablet 600 mg  600 mg Oral Q8H PRN Neta Ehlers, MD      . ondansetron [facility]) tablet 4 mg  4 mg Oral Q8H PRN Neta Ehlers, MD   4 mg at 01/26/14 2131  . QUEtiapine (SEROQUEL) tablet 25 mg  25 mg Oral BID Waylan Boga, NP   25 mg at 01/27/14 9628  . QUEtiapine (SEROQUEL) tablet 50 mg  50 mg Oral QHS Waylan Boga, NP   50 mg at 01/26/14 2127   Current Outpatient Prescriptions  Medication Sig Dispense Refill  . haloperidol lactate (HALDOL) 5 MG/ML injection 5 mg every 30 (thirty) days.        Psychiatric Specialty Exam:     Blood pressure 132/76, pulse 93, temperature 98 F (36.7 C), temperature source Oral, resp. rate 16, SpO2 99.00%.There is no height or weight on file to calculate BMI.  General Appearance: Disheveled  Eye Sport and exercise psychologist::  Fair  Speech:  slurred due to missing teeth  Volume:  Normal  Mood:  Anxious, Depressed and Irritable  Affect:  Flat  Thought Process:  Disorganized  Orientation:  Full (Time, Place, and Person)  Thought Content:  Delusions, Hallucinations: Auditory Visual and Paranoid Ideation  Suicidal Thoughts:  Yes.  with intent/plan  Homicidal Thoughts:  Yes.  without intent/plan  Memory:  Immediate;   Fair Recent;   Fair Remote;   Fair  Judgement:  Poor  Insight:  Lacking  Psychomotor Activity:  Decreased  Concentration:  Fair  Recall:  [place]: Fair  Akathisia:  No  Handed:  Right  AIMS (if indicated):     Assets:  Resilience  Sleep:      Musculoskeletal: Strength & Muscle Tone: within normal limits Gait & Station: normal Patient leans: N/A  Treatment Plan Summary: Inpatient psychiatry and medication stabilization,  Seroquel 25 mg BID and 50 mg at bedtime started.  Levonne Spiller,  MD  01/27/2014 3:04 PM
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
## 2014-01-27 NOTE — ED Notes (Signed)
Up to the desk to use the phone, pt stating "I don't like men..don't like selling my body..." Pt rambling about her sister,then back to her room.

## 2014-01-28 DIAGNOSIS — F1410 Cocaine abuse, uncomplicated: Secondary | ICD-10-CM

## 2014-01-28 DIAGNOSIS — F29 Unspecified psychosis not due to a substance or known physiological condition: Secondary | ICD-10-CM

## 2014-01-28 MED ORDER — QUETIAPINE FUMARATE 100 MG PO TABS
100.0000 mg | ORAL_TABLET | Freq: Every day | ORAL | Status: DC
  Administered 2014-01-28: 100 mg via ORAL
  Filled 2014-01-28: qty 1

## 2014-01-28 MED ORDER — QUETIAPINE FUMARATE 25 MG PO TABS
25.0000 mg | ORAL_TABLET | Freq: Three times a day (TID) | ORAL | Status: DC
  Administered 2014-01-29 (×2): 25 mg via ORAL
  Filled 2014-01-28 (×2): qty 1

## 2014-01-28 NOTE — Progress Notes (Signed)
  CARE MANAGEMENT ED NOTE 01/28/2014  Patient:  [name],[name]   Account Number:  [id number]  Date Initiated:  01/28/2014  Documentation initiated by:  Edd ArbourGIBBS,KIMBERLY  Subjective/Objective Assessment:   53 yr old [place] without [place]cp     Subjective/Objective Assessment Detail:   Pt unable to recall provider name Reports pcp is a female     Action/Plan:   Cm left a list of medicaid [place] providers in pt chart to be given to her upon transfer or d/c   Action/Plan Detail:   Anticipated DC Date:       Status Recommendation to Physician:   Result of Recommendation:    Other ED Services  Consult Working Psychologist, educationallan    DC Planning Services  Other  PCP issues  Outpatient Services - Pt will follow up    Choice offered to / List presented to:            Status of service:  Completed, signed off  ED Comments:   ED Comments Detail:

## 2014-01-28 NOTE — Progress Notes (Signed)
Attempted placement at the following facilities:  [facility]- faxed information [facility]- faxed information [facility]- fax[facility] information [facility]- [facility]- [facility]  [facility]  [facility]   Maryelizabeth Rowanressa Sapphira Harjo, MSW, [place]CSWA, 01/28/2014 Evening Clinical Social Worker [phone]

## 2014-01-28 NOTE — Consult Note (Signed)
  Presented to ED with paranoid ideation and hallucinations of killing herself. Has been started on seroquel which has calmed her but remains vague and paranoid.  Psychiatric Specialty Exam: Physical Exam  ROS  Blood pressure 98/65, pulse 71, temperature 98.1 F (36.7 C), temperature source Oral, resp. rate 16, SpO2 100.00%.There is no height or weight on file to calculate BMI.  General Appearance: Casual  Eye Contact::  Fair  Speech:  Garbled  Volume:  Decreased  Mood:  Dysphoric  Affect:  Congruent  Thought Process:  Irrelevant  Orientation:  Full (Time, Place, and Person)  Thought Content:  Delusions, Hallucinations: Auditory, Paranoid Ideation and Rumination  Suicidal Thoughts:  Yes.  without intent/plan  Homicidal Thoughts:  No  Memory:  Recent;   Fair  Judgement:  Impaired  Insight:  Lacking  Psychomotor Activity:  Decreased  Concentration:  Poor  Recall:  Poor  Akathisia:  Negative  Handed:  Right  AIMS (if indicated):     Assets:  Leisure Time  Sleep:      A: Psychosis NOS. R/o Schizoaffective depressed type. Cocaine use disorder, unspecified.  Plan: Admit to inpatient for stabilization. Cocaine may be contributing to paranoia. Judjement remains unpredcitable.  Continue Seroquel for now.

## 2014-01-29 DIAGNOSIS — R45851 Suicidal ideations: Secondary | ICD-10-CM

## 2014-01-29 DIAGNOSIS — R443 Hallucinations, unspecified: Secondary | ICD-10-CM

## 2014-01-29 NOTE — Consult Note (Signed)
  Review of Systems  Constitutional: Negative.   HENT: Negative.   Eyes: Negative.   Respiratory: Negative.   Cardiovascular: Negative.   Gastrointestinal: Negative.   Genitourinary: Negative.   Musculoskeletal: Negative.   Skin: Negative.   Neurological: Negative.   Endo/Heme/Allergies: Negative.   Psychiatric/Behavioral: Positive for depression and hallucinations. The patient is nervous/anxious and has insomnia.

## 2014-01-29 NOTE — BH Assessment (Signed)
Writer contacted patient's ACT ([facility]) [phone]. Spoke to staff-Laura Nedra HaiLee who was unable to confirm any information about this patient. Sts that all staff members are unavailable and currently in morning meetings.Writer left a message requesting that a representative from [facility] calls this writer back asap. Writer would like to ask when patient last received services? Details of patients medication hx? Baseline? Etc.

## 2014-01-29 NOTE — ED Notes (Signed)
Refused d/c vital signs. 
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
## 2014-01-29 NOTE — BH Assessment (Signed)
Followed up with [facility] and [facility] Sts that patient is not actively receiving services from their company. Patient last received services over a yr ago.  Discharge home per Dr. Ladona Ridgelaylor and Denice BorsShuvon, NP pending connection with a ACT team for follow up referrals. [facility] and [facility] contacted. Awaiting a call back from either to arrange services.

## 2014-01-29 NOTE — BH Assessment (Signed)
Heather from [facility] met with patient today. Will meet with patient again tomorrow to complete a assessment. Patient signed a consent and this Probation officer faxed discharge summary, notes/clinicals, etc to [facility]. Pt to be discharged home with SW follow up for housing needs.

## 2014-01-29 NOTE — BH Assessment (Signed)
Heather from [facility] is on the way to meet with this patient to arrange ACT services with this patient. Writer made Dr. Laurena Spies, NP/Luanne, RN all aware. Once patient has met with Nira Conn she is free to be discharged per Dr. Lovena Le.

## 2014-01-29 NOTE — Progress Notes (Signed)
Late Entry: 3:58pm  CSW called [facility] and spoke with Judeth CornfieldStephanie about the current discharge plans.  Judeth CornfieldStephanie agreed to pick up the patient and take her home.  Judeth CornfieldStephanie was the staff member that picked up the patient and will assist her to return home safely.      Maryelizabeth Rowanressa Cheralyn Oliver, MSW, [place] Heights[place]CSWA, 01/29/2014 Evening Clinical Social Worker [phone]

## 2014-01-29 NOTE — ED Notes (Signed)
Refused d/c vitals.

## 2014-01-29 NOTE — Discharge Instructions (Signed)
Hallucinations and Delusions  You seem to be having hallucinations and/or delusions. You may be hearing voices that no one else can hear. This can seem very real to you. You may be having thoughts and fears that do not make sense to others. This condition can be due to mental disease like schizophrenia. It may be caused by a medical condition, such as an infection or electrolyte disturbance. These symptoms are also seen in drug abusers, especially those who use crack cocaine and amphetamines. Drugs like PCP, LSD, MDMA, peyote, and psilocybin can also cause frightening hallucinations and loss of control.  If your symptoms are due to drug abuse, your mental state should improve as the drug(s) leave your system. Someone you trust should be with you until you are better to protect you and calm your fears. Often tranquilizers are very helpful at controlling hallucinations, anxiety, and destructive behavior. Getting a proper diet and enough sleep is important to recovery. If your symptoms are not due to drugs, or do not improve over several days after stopping drug use, you need further medical or mental health care.  SEEK IMMEDIATE MEDICAL CARE IF:   · Your symptoms get worse, especially if you think your life is in danger  · You have violent or destructive thoughts.  Recovery is possible, but you have to get proper treatment and avoid drugs that are known to cause you trouble.  Document Released: 11/04/2004 Document Revised: 12/20/2011 Document Reviewed: 09/27/2005  ExitCare® Patient Information ©2014 [employer].
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
## 2014-11-13 ENCOUNTER — Emergency Department (HOSPITAL_COMMUNITY)
Admission: EM | Admit: 2014-11-13 | Discharge: 2014-11-14 | Payer: Medicaid Other | Attending: Emergency Medicine | Admitting: Emergency Medicine

## 2014-11-13 ENCOUNTER — Other Ambulatory Visit: Payer: Self-pay

## 2014-11-13 ENCOUNTER — Encounter (HOSPITAL_COMMUNITY): Payer: Self-pay

## 2014-11-13 DIAGNOSIS — R443 Hallucinations, unspecified: Secondary | ICD-10-CM | POA: Diagnosis present

## 2014-11-13 DIAGNOSIS — R45850 Homicidal ideations: Secondary | ICD-10-CM

## 2014-11-13 DIAGNOSIS — Z720 Tobacco use: Secondary | ICD-10-CM | POA: Diagnosis not present

## 2014-11-13 DIAGNOSIS — F29 Unspecified psychosis not due to a substance or known physiological condition: Secondary | ICD-10-CM | POA: Diagnosis present

## 2014-11-13 DIAGNOSIS — F209 Schizophrenia, unspecified: Secondary | ICD-10-CM | POA: Insufficient documentation

## 2014-11-13 DIAGNOSIS — R45851 Suicidal ideations: Secondary | ICD-10-CM

## 2014-11-13 DIAGNOSIS — Z79899 Other long term (current) drug therapy: Secondary | ICD-10-CM | POA: Insufficient documentation

## 2014-11-13 HISTORY — DX: Alcohol abuse, uncomplicated: F10.10

## 2014-11-13 HISTORY — DX: Cocaine abuse, uncomplicated: F14.10

## 2014-11-13 HISTORY — DX: Schizoaffective disorder, bipolar type: F25.0

## 2014-11-13 LAB — CBC
HCT: 39.2 % (ref 36.0–46.0)
Hemoglobin: 12.8 g/dL (ref 12.0–15.0)
MCH: 30.9 pg (ref 26.0–34.0)
MCHC: 32.7 g/dL (ref 30.0–36.0)
MCV: 94.7 fL (ref 78.0–100.0)
PLATELETS: 275 10*3/uL (ref 150–400)
RBC: 4.14 MIL/uL (ref 3.87–5.11)
RDW: 13.8 % (ref 11.5–15.5)
WBC: 9.7 10*3/uL (ref 4.0–10.5)

## 2014-11-13 LAB — COMPREHENSIVE METABOLIC PANEL
ALK PHOS: 55 U/L (ref 39–117)
ALT: 13 U/L (ref 0–35)
AST: 17 U/L (ref 0–37)
Albumin: 3.9 g/dL (ref 3.5–5.2)
Anion gap: 10 (ref 5–15)
BILIRUBIN TOTAL: 0.7 mg/dL (ref 0.3–1.2)
BUN: 12 mg/dL (ref 6–23)
CO2: 27 mmol/L (ref 19–32)
Calcium: 9 mg/dL (ref 8.4–10.5)
Chloride: 103 mmol/L (ref 96–112)
Creatinine, Ser: 0.71 mg/dL (ref 0.50–1.10)
GFR calc non Af Amer: >90 mL/min (ref >90)
Glucose, Bld: 117 mg/dL — ABNORMAL HIGH (ref 70–99)
POTASSIUM: 3.8 mmol/L (ref 3.5–5.1)
SODIUM: 140 mmol/L (ref 135–145)
Total Protein: 7.4 g/dL (ref 6.0–8.3)

## 2014-11-13 LAB — SALICYLATE LEVEL

## 2014-11-13 LAB — ACETAMINOPHEN LEVEL

## 2014-11-13 LAB — ETHANOL

## 2014-11-13 LAB — VALPROIC ACID LEVEL
VALPROIC ACID LVL: 12.4 ug/mL — AB (ref 50.0–100.0)
Valproic Acid Lvl: 20.8 ug/mL — ABNORMAL LOW (ref 50.0–100.0)

## 2014-11-13 MED ORDER — ALUM & MAG HYDROXIDE-SIMETH 200-200-20 MG/5ML PO SUSP: 30.0000 mL | ORAL | Status: DC | PRN

## 2014-11-13 MED ORDER — ONDANSETRON HCL 4 MG PO TABS: 4.0000 mg | ORAL_TABLET | Freq: Three times a day (TID) | ORAL | Status: DC | PRN

## 2014-11-13 MED ORDER — IBUPROFEN 200 MG PO TABS: 600.0000 mg | ORAL_TABLET | Freq: Three times a day (TID) | ORAL | Status: DC | PRN

## 2014-11-13 MED ORDER — PALIPERIDONE ER 6 MG PO TB24
6.0000 mg | ORAL_TABLET | Freq: Every day | ORAL | Status: DC
  Administered 2014-11-13 – 2014-11-14 (×2): 6 mg via ORAL
  Filled 2014-11-13 (×2): qty 1

## 2014-11-13 MED ORDER — DIVALPROEX SODIUM 500 MG PO DR TAB
500.0000 mg | DELAYED_RELEASE_TABLET | Freq: Every day | ORAL | Status: DC
  Administered 2014-11-13: 500 mg via ORAL
  Filled 2014-11-13: qty 1

## 2014-11-13 MED ORDER — TRAZODONE HCL 100 MG PO TABS
100.0000 mg | ORAL_TABLET | Freq: Every day | ORAL | Status: DC
  Administered 2014-11-13: 100 mg via ORAL
  Filled 2014-11-13: qty 1

## 2014-11-13 MED ORDER — ZOLPIDEM TARTRATE 5 MG PO TABS: 5.0000 mg | ORAL_TABLET | Freq: Every evening | ORAL | Status: DC | PRN

## 2014-11-13 MED ORDER — LORAZEPAM 1 MG PO TABS: 1.0000 mg | ORAL_TABLET | Freq: Three times a day (TID) | ORAL | Status: DC | PRN

## 2014-11-13 MED ORDER — ACETAMINOPHEN 325 MG PO TABS: 650.0000 mg | ORAL_TABLET | ORAL | Status: DC | PRN

## 2014-11-13 MED ORDER — NICOTINE 21 MG/24HR TD PT24: 21.0000 mg | MEDICATED_PATCH | Freq: Every day | TRANSDERMAL | Status: DC

## 2014-11-13 NOTE — ED Notes (Signed)
Act Team- Judeth CornfieldStephanie: [phone] office                                     [phone] cell

## 2014-11-13 NOTE — ED Notes (Signed)
Dr. Silverio LayYao reported that patient is medically cleared and can have nighttime dose of depakote. Orders read back and verified.
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
## 2014-11-13 NOTE — ED Notes (Signed)
Bed: [id number] Expected date:  Expected time:  Means of arrival:  Comments: Hold for traige 4 [name]

## 2014-11-13 NOTE — BH Assessment (Signed)
Reviewed provider note and and [facility] to initiating assessment. Pt has hx of schizophrenia, and had recent medication changes. She is endorsing SI, HI, and AVH.   Clista BernhardtNancy Elfrida Pixley, [facility] Triage Specialist 11/13/2014 7:40 PM

## 2014-11-13 NOTE — ED Notes (Signed)
Lab tech at bedside to draw blood

## 2014-11-13 NOTE — ED Notes (Signed)
Patient to treatment area 42 via ambulatory with a steady gait. No acute distress noted.
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
## 2014-11-13 NOTE — ED Provider Notes (Signed)
CSN: [id number]     Arrival date & time 11/13/14  1613 History   First MD Initiated Contact with Patient 11/13/14 1650     Chief Complaint  Patient presents with  . Hallucinations  . Detox   . Suicidal  . Homicidal     (Consider location/radiation/quality/duration/timing/severity/associated sxs/prior Treatment) The history is provided by the patient and medical records.   This is a 54 year old female with past medical history significant for schizophrenia, cocaine abuse, and alcohol abuse, presenting to the ED for medical clearance. Patient is here to emergency room with her activity member. She has recently been switched from Haldol to [place] and Depakote on 10/16/14 by her psychiatrist, Dr. Lucianne MussLima, as her insurance no longer covered her Haldol.  Patient has recently been on a drug and alcohol binge, she drank beer earlier this morning and use cocaine. She states she drinks "whenever I can get my hands on". Patient also admits to command hallucinations which are telling her to kill herself and harm others that are "messing with her". She states she will do this by cutting her wrist.  While waiting in the lobby patient was having visual hallucinations of people staring at her. She is paranoid at present.  Past Medical History  Diagnosis Date  . Schizophrenia   . Schizoaffective disorder, bipolar type   . Cocaine abuse   . Alcohol abuse    Past Surgical History  Procedure Laterality Date  . Abdominal surgery     History reviewed. No pertinent family history. History  Substance Use Topics  . Smoking status: Current Every Day Smoker -- 0.00 packs/day    Types: Cigarettes  . Smokeless tobacco: Not on file  . Alcohol Use: Yes     Comment: patient drinks beer and wine daily   OB History    No data available     Review of Systems  Psychiatric/Behavioral: Positive for suicidal ideas and hallucinations.  All other systems reviewed and are negative.     Allergies  Review of  patient's allergies indicates no known allergies.  Home Medications   Prior to Admission medications   Medication Sig Start Date End Date Taking? Authorizing Provider  haloperidol lactate (HALDOL) 5 MG/ML injection 5 mg every 30 (thirty) days.    Historical Provider, MD   BP 138/50 mmHg  Pulse 65  Temp(Src) 97.7 F (36.5 C) (Oral)  Resp 18  SpO2 100%  LMP 09/07/2012   Physical Exam  Constitutional: She is oriented to person, place, and time. She appears well-developed and well-nourished. No distress.  HENT:  Head: Normocephalic and atraumatic.  Mouth/Throat: Oropharynx is clear and moist.  Eyes: Conjunctivae and EOM are normal. Pupils are equal, round, and reactive to light.  Neck: Normal range of motion. Neck supple.  Cardiovascular: Normal rate, regular rhythm and normal heart sounds.   Pulmonary/Chest: Effort normal and breath sounds normal. No respiratory distress. She has no wheezes.  Abdominal: Soft. Bowel sounds are normal. There is no tenderness. There is no guarding.  Musculoskeletal: Normal range of motion. She exhibits no edema.  Neurological: She is alert and oriented to person, place, and time.  Skin: Skin is warm and dry. She is not diaphoretic.  Psychiatric: She has a normal mood and affect. She is actively hallucinating. She expresses homicidal and suicidal ideation. She expresses suicidal plans and homicidal plans.  Active auditory and visual hallucinations; SI and HI present  Nursing note and vitals reviewed.   ED Course  Procedures (including critical care time)  Labs Review Labs Reviewed  ACETAMINOPHEN LEVEL - Abnormal; Notable for the following:    Acetaminophen (Tylenol), Serum <10.0 (*)    All other components within normal limits  COMPREHENSIVE METABOLIC PANEL - Abnormal; Notable for the following:    Glucose, Bld 117 (*)    All other components within normal limits  VALPROIC ACID LEVEL - Abnormal; Notable for the following:    Valproic Acid Lvl  20.8 (*)    All other components within normal limits  CBC  ETHANOL  SALICYLATE LEVEL  URINE RAPID DRUG SCREEN (HOSP PERFORMED)    Imaging Review No results found.   EKG Interpretation None      MDM   Final diagnoses:  Hallucination  Suicidal ideation  Homicidal ideation   54 year old female with hallucinations, suicidal and homicidal ideation. She appears paranoid in the ED.  She recently has had medication changes by her psychiatrist-- switched from Haldol to invega and Depakote on 10/16/14 due to change in insurance coverage.  She admits to ongoing alcohol and cocaine abuse and is requesting detox.  ACT member present in the ED with patient. Lab work as above, no acute abnormalities. Patient medically cleared and awaiting TTS evaluation.  Garlon Hatchet, PA-C 11/13/14 2014  Merrie Roof, MD 11/14/14 [phone]

## 2014-11-13 NOTE — ED Notes (Signed)
Patient attempted to get urinalysis attempt unsuccessful at this time. Will continue to try to obtain.

## 2014-11-13 NOTE — ED Provider Notes (Signed)
Patient told the counselor that she may have overdosed on alcohol, depakote today. Unknown ingestion time. Patient paranoid and disoriented. I called Poison control, who recommend 4 hr depakote, if trending down, doesn't need repeat and then patient is medically cleared. Also recommend EKG. EKG showed 1st degree AV block with nonspecific changes. Will f/u depakote level. If continues to elevate, will check Q4-6 hr level until it trends down and monitor mental status.    Date: 11/13/2014  Rate: 63  Rhythm: normal sinus rhythm  QRS Axis: normal  Intervals: PR prolonged  ST/T Wave abnormalities: nonspecific ST changes  Conduction Disutrbances:first-degree A-V block   Narrative Interpretation:   Old EKG Reviewed: none available  10:45 PM Depakote level trending down. Medically cleared. Can start taking depakote.   Richardean Canalavid H Yao, MD 11/13/14 2245

## 2014-11-14 ENCOUNTER — Inpatient Hospital Stay: Payer: Self-pay | Admitting: Psychiatry

## 2014-11-14 ENCOUNTER — Encounter (HOSPITAL_COMMUNITY): Payer: Self-pay | Admitting: *Deleted

## 2014-11-14 DIAGNOSIS — R443 Hallucinations, unspecified: Secondary | ICD-10-CM | POA: Insufficient documentation

## 2014-11-14 DIAGNOSIS — F1099 Alcohol use, unspecified with unspecified alcohol-induced disorder: Secondary | ICD-10-CM

## 2014-11-14 DIAGNOSIS — R45851 Suicidal ideations: Secondary | ICD-10-CM

## 2014-11-14 DIAGNOSIS — Z8659 Personal history of other mental and behavioral disorders: Secondary | ICD-10-CM

## 2014-11-14 DIAGNOSIS — F1490 Cocaine use, unspecified, uncomplicated: Secondary | ICD-10-CM

## 2014-11-14 DIAGNOSIS — R45850 Homicidal ideations: Secondary | ICD-10-CM | POA: Insufficient documentation

## 2014-11-14 DIAGNOSIS — F29 Unspecified psychosis not due to a substance or known physiological condition: Secondary | ICD-10-CM

## 2014-11-14 LAB — TSH: Thyroid Stimulating Horm: 1.35 u[IU]/mL

## 2014-11-14 LAB — LIPID PANEL
CHOLESTEROL: 167 mg/dL (ref 0–200)
HDL Cholesterol: 52 mg/dL (ref 40–60)
Ldl Cholesterol, Calc: 93 mg/dL (ref 0–100)
Triglycerides: 111 mg/dL (ref 0–200)
VLDL Cholesterol, Calc: 22 mg/dL (ref 5–40)

## 2014-11-14 LAB — HEMOGLOBIN A1C: Hemoglobin A1C: 5.2 % (ref 4.2–6.3)

## 2014-11-14 NOTE — Progress Notes (Signed)
Pt accepted [facility] pending bed availability. CSW awaiting return call with bed assignment.   Byrd HesselbachKristen Shakyia Bosso, LCSW [phone]  ED CSW 11/14/2014 1157am

## 2014-11-14 NOTE — ED Notes (Signed)
Patient still concern about being on ward with all these mens. Patient informed that she was safe and that she would possible moving across the street after 9:00 am. Patient still unable to void. Encouragement and support provided and safety maintain. Q 15 min checks remain in place.

## 2014-11-14 NOTE — BH Assessment (Signed)
[facility] Assessment Progress Note  Per Thedore MinsMojeed Akintayo, MD, this pt requires psychiatric hospitalization at this time.  Per Olivia Mackieressa at [facility], pt has been accepted to their facility by Dr Ardyth HarpsHernandez to Rm 307.  Please call report to [phone].  [facility] is ready to receive pt at any time.  Dahlia ByesJosephine Onuoha, NP has been notified, as has pt's nurse, Minerva AreolaEric.  Olivia Mackieressa asks whether pt will be under voluntary or involuntary status.  After discussion with Julieanne CottonJosephine and with EDP Vanetta MuldersScott Zackowski, MD, it has been determined that pt should be transferred under IVC.  Dr Deretha EmoryZackowski has initiated IVC, and petition has been faxed to [facility] [place].  The magistrate has received the petition, and Findings and Custody Order has been served.  Completed commitment papers [facility].  Doylene Canninghomas Aneth Schlagel, MA Triage Specialist 11/14/2014 @ 14:58
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
## 2014-11-14 NOTE — Progress Notes (Signed)
54 yr old female [place] medicaid [place] access with [facility] listed by medicaid as pcp.  Pt requesting detox form crack and etoh. Pt informed by CM the name of her medicaid assigned pcp and she informed Cm she was not going to see a pcp in that area because "It is dangerous over there."  CM explained to pt that to change her medicaid assigned DSS she would need to speak with staff at DSS at [phone] Provided pt with this information for discharge instructions/follow-up Pt provided Cm with her [employer] ACT (female) number [phone] Pt handed CM a piece of paper with this contact information and informed CM her ACT member would be visiting her in [place] today.

## 2014-11-16 LAB — URINALYSIS, COMPLETE
BILIRUBIN, UR: NEGATIVE
BLOOD: NEGATIVE
Bacteria: NONE SEEN
Glucose,UR: NEGATIVE mg/dL (ref 0–75)
Ketone: NEGATIVE
Leukocyte Esterase: NEGATIVE
Nitrite: NEGATIVE
Ph: 6 (ref 4.5–8.0)
Protein: NEGATIVE
SPECIFIC GRAVITY: 1.013 (ref 1.003–1.030)
Squamous Epithelial: 6

## 2014-11-16 LAB — DRUG SCREEN, URINE
Amphetamines, Ur Screen: NEGATIVE (ref ?–1000)
BENZODIAZEPINE, UR SCRN: NEGATIVE (ref ?–200)
Barbiturates, Ur Screen: NEGATIVE (ref ?–200)
CANNABINOID 50 NG, UR ~~LOC~~: NEGATIVE (ref ?–50)
Cocaine Metabolite,Ur ~~LOC~~: POSITIVE (ref ?–300)
MDMA (ECSTASY) UR SCREEN: NEGATIVE (ref ?–500)
Methadone, Ur Screen: NEGATIVE (ref ?–300)
Opiate, Ur Screen: NEGATIVE (ref ?–300)
PHENCYCLIDINE (PCP) UR S: NEGATIVE (ref ?–25)
Tricyclic, Ur Screen: NEGATIVE (ref ?–1000)

## 2014-12-28 ENCOUNTER — Emergency Department (HOSPITAL_COMMUNITY)
Admission: EM | Admit: 2014-12-28 | Discharge: 2014-12-31 | Disposition: A | Discharge status: Home / Self Care | Payer: Medicaid Other | Attending: Dermatology | Admitting: Dermatology

## 2014-12-28 ENCOUNTER — Encounter (HOSPITAL_COMMUNITY): Payer: Self-pay | Admitting: Emergency Medicine

## 2014-12-28 DIAGNOSIS — Z720 Tobacco use: Secondary | ICD-10-CM | POA: Diagnosis not present

## 2014-12-28 DIAGNOSIS — F1994 Other psychoactive substance use, unspecified with psychoactive substance-induced mood disorder: Secondary | ICD-10-CM | POA: Diagnosis not present

## 2014-12-28 DIAGNOSIS — F209 Schizophrenia, unspecified: Secondary | ICD-10-CM | POA: Diagnosis not present

## 2014-12-28 DIAGNOSIS — Z3202 Encounter for pregnancy test, result negative: Secondary | ICD-10-CM | POA: Insufficient documentation

## 2014-12-28 DIAGNOSIS — F23 Brief psychotic disorder: Secondary | ICD-10-CM

## 2014-12-28 DIAGNOSIS — R45851 Suicidal ideations: Secondary | ICD-10-CM | POA: Diagnosis not present

## 2014-12-28 DIAGNOSIS — R45850 Homicidal ideations: Secondary | ICD-10-CM | POA: Diagnosis not present

## 2014-12-28 LAB — COMPREHENSIVE METABOLIC PANEL
ALT: 13 U/L (ref 0–35)
ANION GAP: 6 (ref 5–15)
AST: 17 U/L (ref 0–37)
Albumin: 4.2 g/dL (ref 3.5–5.2)
Alkaline Phosphatase: 60 U/L (ref 39–117)
BILIRUBIN TOTAL: 0.3 mg/dL (ref 0.3–1.2)
BUN: 8 mg/dL (ref 6–23)
CALCIUM: 9.6 mg/dL (ref 8.4–10.5)
CHLORIDE: 103 mmol/L (ref 96–112)
CO2: 28 mmol/L (ref 19–32)
Creatinine, Ser: 0.66 mg/dL (ref 0.50–1.10)
Glucose, Bld: 86 mg/dL (ref 70–99)
Potassium: 3.6 mmol/L (ref 3.5–5.1)
Sodium: 137 mmol/L (ref 135–145)
TOTAL PROTEIN: 7.9 g/dL (ref 6.0–8.3)

## 2014-12-28 LAB — CBC
HEMATOCRIT: 37.1 % (ref 36.0–46.0)
Hemoglobin: 12.2 g/dL (ref 12.0–15.0)
MCH: 31 pg (ref 26.0–34.0)
MCHC: 32.9 g/dL (ref 30.0–36.0)
MCV: 94.4 fL (ref 78.0–100.0)
Platelets: 336 10*3/uL (ref 150–400)
RBC: 3.93 MIL/uL (ref 3.87–5.11)
RDW: 13.4 % (ref 11.5–15.5)
WBC: 9.1 10*3/uL (ref 4.0–10.5)

## 2014-12-28 LAB — LIPASE, BLOOD: Lipase: 31 U/L (ref 11–59)

## 2014-12-28 LAB — ETHANOL: Alcohol, Ethyl (B): <5 mg/dL (ref 0–9)

## 2014-12-28 MED ORDER — DIVALPROEX SODIUM 500 MG PO DR TAB
500.0000 mg | DELAYED_RELEASE_TABLET | Freq: Every day | ORAL | Status: DC
  Administered 2014-12-29 – 2014-12-30 (×2): 500 mg via ORAL
  Filled 2014-12-28 (×2): qty 1

## 2014-12-28 MED ORDER — TRAZODONE HCL 100 MG PO TABS
100.0000 mg | ORAL_TABLET | Freq: Every day | ORAL | Status: DC
  Administered 2014-12-29 – 2014-12-30 (×2): 100 mg via ORAL
  Filled 2014-12-28 (×2): qty 1

## 2014-12-28 NOTE — BH Assessment (Signed)
Assessment completed. Consulted Letta KocherIjeoma Nwaeze,NP who agrees that pt meets inpatient treatment criteria. TTS will contact other facilities for placement. Informed Dr. Denton LankSteinl of the recommendation.

## 2014-12-28 NOTE — ED Notes (Signed)
RN approached pt to speak to assess her. Pt states "Go away and just let me sleep. I ain't in the mood to talk to anybody, I'm tired". Pt refusing to turn towards RN and is facing the wall with blanket on. No s/s of distress noted.

## 2014-12-28 NOTE — ED Notes (Signed)
Pt acuity high, due to SI statements in triage. Pt labile, uncooperative.
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
## 2014-12-28 NOTE — ED Provider Notes (Signed)
CSN: [id number]     Arrival date & time 12/28/14  2019 History   First MD Initiated Contact with Patient 12/28/14 2026     Chief Complaint  Patient presents with  . Suicidal  . Homicidal  . Abdominal Pain     (Consider location/radiation/quality/duration/timing/severity/associated sxs/prior Treatment) Patient is a 54 y.o. female presenting with abdominal pain. The history is provided by the patient. The history is limited by the condition of the patient.  Abdominal Pain pt with hx schizophrenia, from a friends home, pt had by report voiced suicidal and homicidal thoughts, had c/o abdominal pain earlier, although currently just tells me she wants some 'cheetos'.   Pt w hx substance abuse/cocaine abuse, etoh abuse.  Pt denies overdose or ingestion. Pt very poor/difficult historian - level 5 caveat.       Past Medical History  Diagnosis Date  . Schizophrenia   . Schizoaffective disorder, bipolar type   . Cocaine abuse   . Alcohol abuse    Past Surgical History  Procedure Laterality Date  . Abdominal surgery     No family history on file. History  Substance Use Topics  . Smoking status: Current Every Day Smoker -- 0.00 packs/day    Types: Cigarettes  . Smokeless tobacco: Not on file  . Alcohol Use: Yes     Comment: patient drinks beer and wine daily   OB History    No data available        Review of Systems  Unable to perform ROS: Psychiatric disorder  Gastrointestinal: Positive for abdominal pain.  level 5 caveat, psychiatric illness, will not answer ros questions    Allergies  Review of patient's allergies indicates no known allergies.  Home Medications   Prior to Admission medications   Medication Sig Start Date End Date Taking? Authorizing Provider  divalproex (DEPAKOTE) 500 MG DR tablet Take 500 mg by mouth at bedtime.    Historical Provider, MD  paliperidone (INVEGA) 6 MG 24 hr tablet Take 6 mg by mouth daily.    Historical Provider, MD  Paliperidone  Palmitate 234 MG/1.5ML SUSP Inject 1 Syringe into the muscle. Every four weeks.    Historical Provider, MD  traZODone (DESYREL) 100 MG tablet Take 100 mg by mouth at bedtime.    Historical Provider, MD   BP 156/55 mmHg  Pulse 69  Temp(Src) 97.4 F (36.3 C) (Oral)  SpO2 100%  LMP 09/07/2012 Physical Exam  Constitutional: She appears well-developed and well-nourished. No distress.  HENT:  Head: Atraumatic.  Mouth/Throat: Oropharynx is clear and moist.  Eyes: Conjunctivae are normal. Pupils are equal, round, and reactive to light. No scleral icterus.  Neck: Neck supple. No tracheal deviation present.  No stiffness or rigidity  Cardiovascular: Normal rate, regular rhythm, normal heart sounds and intact distal pulses.   Pulmonary/Chest: Effort normal and breath sounds normal. No respiratory distress.  Abdominal: Soft. Normal appearance and bowel sounds are normal. She exhibits no distension and no mass. There is no tenderness. There is no rebound and no guarding.  Genitourinary:  No cva tenderness  Musculoskeletal: She exhibits no edema.  Neurological: She is alert.  Alert, moves bil ext purposefully w good strength. Ambulates w steady gait. No tremor or shakes.   Skin: Skin is warm and dry. No rash noted. She is not diaphoretic.  Psychiatric:  Pt will not answer majority of questions answers. Looks about room suspiciously. Anxious.   Nursing note and vitals reviewed.   ED Course  Procedures (including critical  care time) Labs Review   Results for orders placed or performed during the hospital encounter of 12/28/14  CBC  Result Value Ref Range   WBC 9.1 4.0 - 10.5 K/uL   RBC 3.93 3.87 - 5.11 MIL/uL   Hemoglobin 12.2 12.0 - 15.0 g/dL   HCT 16.137.1 09.636.0 - 04.546.0 %   MCV 94.4 78.0 - 100.0 fL   MCH 31.0 26.0 - 34.0 pg   MCHC 32.9 30.0 - 36.0 g/dL   RDW 40.913.4 81.111.5 - 91.415.5 %   Platelets 336 150 - 400 K/uL  Comprehensive metabolic panel  Result Value Ref Range   Sodium 137 135 - 145  mmol/L   Potassium 3.6 3.5 - 5.1 mmol/L   Chloride 103 96 - 112 mmol/L   CO2 28 19 - 32 mmol/L   Glucose, Bld 86 70 - 99 mg/dL   BUN 8 6 - 23 mg/dL   Creatinine, Ser 7.820.66 0.50 - 1.10 mg/dL   Calcium 9.6 8.4 - 95.610.5 mg/dL   Total Protein 7.9 6.0 - 8.3 g/dL   Albumin 4.2 3.5 - 5.2 g/dL   AST 17 0 - 37 U/L   ALT 13 0 - 35 U/L   Alkaline Phosphatase 60 39 - 117 U/L   Total Bilirubin 0.3 0.3 - 1.2 mg/dL   GFR calc non Af Amer >90 >90 mL/min   GFR calc Af Amer >90 >90 mL/min   Anion gap 6 5 - 15  Lipase, blood  Result Value Ref Range   Lipase 31 11 - 59 U/L  Ethanol  Result Value Ref Range   Alcohol, Ethyl (B) <5 0 - 9 mg/dL      MDM   Labs.  Reviewed nursing notes and prior charts for additional history.   Psych hold orders placed.  Recheck pt calm and alert.  Discussed w psych team - they have assessed and are working on inpatient placement.  Disposition per psych team.     Cathren LaineKevin Gwynneth Fabio, MD 12/28/14 2212

## 2014-12-29 DIAGNOSIS — F1994 Other psychoactive substance use, unspecified with psychoactive substance-induced mood disorder: Secondary | ICD-10-CM | POA: Diagnosis present

## 2014-12-29 DIAGNOSIS — R45851 Suicidal ideations: Secondary | ICD-10-CM

## 2014-12-29 LAB — RAPID URINE DRUG SCREEN, HOSP PERFORMED
Amphetamines: NOT DETECTED
BARBITURATES: NOT DETECTED
BENZODIAZEPINES: NOT DETECTED
COCAINE: POSITIVE — AB
OPIATES: NOT DETECTED
Tetrahydrocannabinol: NOT DETECTED

## 2014-12-29 LAB — PREGNANCY, URINE: PREG TEST UR: NEGATIVE

## 2014-12-29 NOTE — ED Notes (Signed)
Pt acuity moderate; pt asleep with no inappropriate behaviors noted. 

## 2014-12-29 NOTE — Consult Note (Signed)
[place] Psychiatry Consult   Reason for Consult: Substance induced mood disorder Referring Physician:  EDP Patient Identification: [name] MRN:  [id number] Principal Diagnosis: Substance induced mood disorder Diagnosis:   Patient Active Problem List   Diagnosis Date Noted  . Substance induced mood disorder [F19.94] 12/29/2014    Priority: High  . Psychosis [F29] 01/26/2014    Priority: High  . Hallucination [R44.3]   . Homicidal ideation [R45.850]   . Suicidal ideation [R45.851]   . Schizophrenia [F20.9] 01/26/2014  . Polysubstance abuse [F19.10] 01/26/2014    Total Time spent with patient: 1 hour  Subjective:   [name] is a 54 y.o. female patient admitted with Auditory hallucination, substance induced mood disorder.  HPI: AA female, 54 years old was evaluated this morning for auditory hallucination, suicidal ideation and homicidal ideation.  She was last seen and discharged home back in Feb this year for  same complaint.  Patient reported that she is using unknown amount of  Cocaine daily and that after using she feels" terrible"  She reported feeling homicidal towards her boyfriend because he is trying to kill her.  She could not articulate well and could not explain why she is here.  She reported previous hospitalizations  For Schizophrenia and substance use.  Patient could not participate in the entire interview and slept off.  We will keep patient overnight and re-evaluate in am.  She still endorses suicide and Homicide towards her boyfriend.   Her ACT team, [facility] have been contacted by phone and a message left for them to call back.  HPI Elements:   Location:  Substance induced mood disorder, homicidal ideation, suicide ideation. Quality:  severe, . Severity:  severe. Timing:  acute. Duration:  chronic mental illness and substance use disorder. Context:  Seeking treatment for substance use disorder..  Past Medical History:  Past Medical History   Diagnosis Date  . Schizophrenia   . Schizoaffective disorder, bipolar type   . Cocaine abuse   . Alcohol abuse     Past Surgical History  Procedure Laterality Date  . Abdominal surgery     Family History: No family history on file. Social History:  History  Alcohol Use  . Yes    Comment: patient drinks beer and wine daily     History  Drug Use  . Yes  . Special: "Crack" cocaine    Comment: crack use daily    History   Social History  . Marital Status: Single    Spouse Name: N/A  . Number of Children: N/A  . Years of Education: N/A   Social History Main Topics  . Smoking status: Current Every Day Smoker -- 0.00 packs/day    Types: Cigarettes  . Smokeless tobacco: Not on file  . Alcohol Use: Yes     Comment: patient drinks beer and wine daily  . Drug Use: Yes    Special: "Crack" cocaine     Comment: crack use daily  . Sexual Activity: Not on file   Other Topics Concern  . None   Social History Narrative   Additional Social History:    Pain Medications: Pt denies abuse.  Prescriptions: Pt denies abuse.  Over the Counter: Pt denies abuse. History of alcohol / drug use?: Yes Longest period of sobriety (when/how long): UTA Name of Substance 1: ETOH 1 - Age of First Use: 16 1 - Amount (size/oz): "As much as I can get" 1 - Frequency: "As often as I can"  1 - Duration: unknown, episodic use reported 1 - Last Use / Amount: 12-28-14 Name of Substance 2: Crack  2 - Age of First Use: unknown  2 - Amount (size/oz): "A whole lot" 2 - Frequency: "As often as I can get it"  2 - Duration: unknown  2 - Last Use / Amount: 12-28-14                 Allergies:  No Known Allergies  Labs:  Results for orders placed or performed during the hospital encounter of 12/28/14 (from the past 48 hour(s))  CBC     Status: None   Collection Time: 12/28/14  9:01 PM  Result Value Ref Range   WBC 9.1 4.0 - 10.5 K/uL   RBC 3.93 3.87 - 5.11 MIL/uL   Hemoglobin 12.2 12.0 -  15.0 g/dL   HCT 37.1 36.0 - 46.0 %   MCV 94.4 78.0 - 100.0 fL   MCH 31.0 26.0 - 34.0 pg   MCHC 32.9 30.0 - 36.0 g/dL   RDW 13.4 11.5 - 15.5 %   Platelets 336 150 - 400 K/uL  Comprehensive metabolic panel     Status: None   Collection Time: 12/28/14  9:01 PM  Result Value Ref Range   Sodium 137 135 - 145 mmol/L   Potassium 3.6 3.5 - 5.1 mmol/L   Chloride 103 96 - 112 mmol/L   CO2 28 19 - 32 mmol/L   Glucose, Bld 86 70 - 99 mg/dL   BUN 8 6 - 23 mg/dL   Creatinine, Ser 0.66 0.50 - 1.10 mg/dL   Calcium 9.6 8.4 - 10.5 mg/dL   Total Protein 7.9 6.0 - 8.3 g/dL   Albumin 4.2 3.5 - 5.2 g/dL   AST 17 0 - 37 U/L   ALT 13 0 - 35 U/L   Alkaline Phosphatase 60 39 - 117 U/L   Total Bilirubin 0.3 0.3 - 1.2 mg/dL   GFR calc non Af Amer >90 >90 mL/min   GFR calc Af Amer >90 >90 mL/min    Comment: (NOTE) The eGFR has been calculated using the CKD EPI equation. This calculation has not been validated in all clinical situations. eGFR's persistently <90 mL/min signify possible Chronic Kidney Disease.    Anion gap 6 5 - 15  Lipase, blood     Status: None   Collection Time: 12/28/14  9:01 PM  Result Value Ref Range   Lipase 31 11 - 59 U/L  Ethanol     Status: None   Collection Time: 12/28/14  9:01 PM  Result Value Ref Range   Alcohol, Ethyl (B) <5 0 - 9 mg/dL    Comment:        LOWEST DETECTABLE LIMIT FOR SERUM ALCOHOL IS 11 mg/dL FOR MEDICAL PURPOSES ONLY     Vitals: Blood pressure 118/76, pulse 65, temperature 97.6 F (36.4 C), temperature source Oral, resp. rate 16, last menstrual period 09/07/2012, SpO2 98 %.  Risk to Self: Suicidal Ideation: Yes-Currently Present Suicidal Intent: Yes-Currently Present Is patient at risk for suicide?: Yes Suicidal Plan?: Yes-Currently Present Specify Current Suicidal Plan: "Lay out in the middle of the road".  Access to Means: Yes Specify Access to Suicidal Means: Pt has access to traffic.  What has been your use of drugs/alcohol within the last  12 months?: Pt reported that she drinks beer and uses crack. How many times?: 1 Other Self Harm Risks: No other self harm risk identified at this time.  Triggers  for Past Attempts: Unpredictable Intentional Self Injurious Behavior: None Risk to Others: Homicidal Ideation: Yes-Currently Present Thoughts of Harm to Others: Yes-Currently Present Comment - Thoughts of Harm to Others: "I'll hurt anybody". Current Homicidal Intent: Yes-Currently Present Current Homicidal Plan: No Describe Current Homicidal Plan: Pt did not report a plan.  Access to Homicidal Means: No Identified Victim: "Whoever bothers me".  History of harm to others?: No Assessment of Violence: On admission Violent Behavior Description: No violent behaviors observed at this time. Pt is calm.  Does patient have access to weapons?: No Criminal Charges Pending?: Yes Describe Pending Criminal Charges: "Not paying a taxi cab driver". Does patient have a court date: Yes Court Date: 01/17/15 Prior Inpatient Therapy: Prior Inpatient Therapy: Yes Prior Therapy Dates: 2000-to present Prior Therapy Facilty/Provider(s): [facility], [place], [place]  Reason for Treatment: Schizophrenia  Prior Outpatient Therapy: Prior Outpatient Therapy: Yes Prior Therapy Dates: Current Prior Therapy Facilty/Provider(s[facility] [place] [phone] (Pt is unable to recall the name of the agency. ) Reason for Treatment: Schizophrenia   Current Facility-Administered Medications  Medication Dose Route Frequency Provider Last Rate Last Dose  . divalproex (DEPAKOTE) DR tablet 500 mg  500 mg Oral QHS Lajean Saver, MD   500 mg at 12/28/14 2248  . traZODone (DESYREL) tablet 100 mg  100 mg Oral QHS Lajean Saver, MD   100 mg at 12/28/14 2248   Current Outpatient Prescriptions  Medication Sig Dispense Refill  . divalproex (DEPAKOTE) 500 MG DR tablet Take 500 mg by mouth at bedtime.    . paliperidone (INVEGA) 6 MG 24 hr tablet Take 6 mg by mouth daily.    .  Paliperidone Palmitate 234 MG/1.5ML SUSP Inject 1 Syringe into the muscle. Every four weeks.    . traZODone (DESYREL) 100 MG tablet Take 100 mg by mouth at bedtime.      Musculoskeletal: Strength & Muscle Tone: seen in bed covered up Gait & Station: lying in bed Patient leans: see above.  Psychiatric Specialty Exam:     Blood pressure 118/76, pulse 65, temperature 97.6 F (36.4 C), temperature source Oral, resp. rate 16, last menstrual period 09/07/2012, SpO2 98 %.There is no height or weight on file to calculate BMI.  General Appearance: Casual  Eye Contact::  None  Speech:  Garbled and Slow  Volume:  Decreased  Mood:  Depressed  Affect:  Depressed and Flat  Thought Process:  Coherent, Goal Directed and Intact  Orientation:  Full (Time, Place, and Person)  Thought Content:  Hallucinations: Auditory  Suicidal Thoughts:  Yes.  without intent/plan  Homicidal Thoughts:  Yes.  without intent/plan  Memory:  Immediate;   Good Recent;   Fair Remote;   Fair  Judgement:  Impaired  Insight:  Shallow  Psychomotor Activity:  Decreased and Psychomotor Retardation  Concentration:  Poor  Recall:  NA  Fund of Knowledge:Fair  Language: Good  Akathisia:  NA  Handed:  Right  AIMS (if indicated):     Assets:  Desire for Improvement  ADL's:  Intact  Cognition: Impaired,  Moderate  Sleep:      Medical Decision Making: Self-Limited or Minor (1), Review of Psycho-Social Stressors (1) and Established Problem, Worsening (2)  Treatment Plan Summary: Daily contact with patient to assess and evaluate symptoms and progress in treatment, Medication management and Plan re-evaluate in am for appropriate disposition.  Plan:  Re-evaluate in am for appropriate disposition. Disposition: see above  Delfin Gant    PMHNP-BC 12/29/2014 2:47 PM

## 2014-12-29 NOTE — Progress Notes (Signed)
Attempted to secure placement with the following facilities:  [facility]- [facility]- [facility]- call back [facility]- [facility]a [facility]- fax [facility]- no answer [facility]- Kala- only female geri beds [facility]- Debbie- only 1 female [facility]- [facility] Ant[facility]aqu[facility]- no beds [facility]- [facility]- no beds [facility]- Kay- only 1 beds [facility]- fax [facility]- [facility]- no beds [facility]- Shanda B[facility]- no beds [facility]- Scott- no beds Laurena Slimmerannon- Mathew- no beds  CSW spoke with Dorinda Hillonald with [facility] to confirm his participation. Dorinda HillDonald reports he will come to the ED to visit her today.    Maryelizabeth Rowanressa Kaileah Shevchenko, MSW, LCSW, LCAS-A Evening Clinical Social Worker [phone]

## 2014-12-29 NOTE — ED Notes (Addendum)
Pt is awake and alert. Pt has c/o N/V/D. Pt was given ginger ale, which helped with N/V. Pt reports 1 bout of diarrhea. MD Napp notified. Will notify is more than one episode of diarrhea persist. Will continue to monitor for safety. tlewis RN

## 2014-12-29 NOTE — ED Notes (Signed)
Pt acuity moderate; pt asleep with no inappropriate behaviors noted.

## 2014-12-29 NOTE — [facility] Counselor (Signed)
[name], AC at [facility], confirms acute unit is currently at capacity. Contacted the following facilities for placement:  BED AVAILABLE, FAXED CLINICAL INFORMATION: [facility], per [facility], per Paul  AT CAPACITY: [facility], per [facility], per [facility], per [facility], per Nat[facility], per [facility], per [facility], per [facility], per [facility], per Dave [facility], per [facility], per Ann [place] [facility], per Larry [facility], per Nikki [facility], per Claud[facility], per Stephanie  NO RESPONSE: [facility] Brynn Marr   [name] [name], LPC, NCC Triage Specialist [phone] 

## 2014-12-30 DIAGNOSIS — R45851 Suicidal ideations: Secondary | ICD-10-CM | POA: Diagnosis not present

## 2014-12-30 DIAGNOSIS — R45850 Homicidal ideations: Secondary | ICD-10-CM | POA: Diagnosis not present

## 2014-12-30 DIAGNOSIS — F1994 Other psychoactive substance use, unspecified with psychoactive substance-induced mood disorder: Secondary | ICD-10-CM | POA: Diagnosis not present

## 2014-12-30 MED ORDER — IBUPROFEN 200 MG PO TABS
600.0000 mg | ORAL_TABLET | Freq: Four times a day (QID) | ORAL | Status: DC | PRN
  Administered 2014-12-31: 600 mg via ORAL
  Filled 2014-12-30: qty 3

## 2014-12-30 NOTE — ED Notes (Signed)
Pt resting quietly in bed with eyes closed. Respirations are even and unlabored. No acute distress noted. Safety has been maintained with q15 minute observation. Will continue current POC 

## 2014-12-30 NOTE — ED Notes (Signed)
Pt awake, alert & responsive, no distress noted.  Watching TV at present, monitoring for safety, q 15 min checks in place.

## 2014-12-30 NOTE — Consult Note (Signed)
[facility] Face-to-Face Psychiatry Consult   Reason for Consult: Substance induced mood disorder Referring Physician:  EDP Patient Identification: [name] MRN:  [id number] Principal Diagnosis: Substance induced mood disorder Diagnosis:   Patient Active Problem List   Diagnosis Date Noted  . Substance induced mood disorder [F19.94] 12/29/2014  . Hallucination [R44.3]   . Homicidal ideation [R45.850]   . Suicidal ideation [R45.851]   . Schizophrenia [F20.9] 01/26/2014  . Polysubstance abuse [F19.10] 01/26/2014  . Psychosis [F29] 01/26/2014    Total Time spent with patient: 25 minutes  Subjective:   [name] is a 54 y.o. female patient admitted with auditory hallucination, substance induced mood disorder.  HPI: AA female, 54 years old was evaluated this morning by Dr. Jannifer Waller and Danielle Freeze, NP.  She reports her sleep is not good and "I feel like people are trying to kill me." She endorses suicidal ideation with a plan "to cut my wrist." She reports she had a previous attempt "the other night when I was going to take a bunch of pills" but states that she stopped herself. She endorses homicidal ideation without intent or plan towards "family who do me wrong." She denies auditory or visual hallucinations.  She report drinking alcohol "as much as I can get." She states she has tumultuous relationship with her boyfriend "[name]" and states he is trying to kill me.  She reports she was hospitalized and [facility] earlier this year. suicidal ideation and homicidal ideation.  Patient reported that she is using unknown amount of cocaine daily and that after using she feels" terrible"    HPI Elements:   Location:  Substance induced mood disorder, homicidal ideation, suicide ideation. Quality:  severe, . Severity:  severe. Timing:  acute. Duration:  chronic mental illness and substance use disorder. Context:  Seeking treatment for substance use disorder..  Past Medical History:   Past Medical History  Diagnosis Date  . Schizophrenia   . Schizoaffective disorder, bipolar type   . Cocaine abuse   . Alcohol abuse     Past Surgical History  Procedure Laterality Date  . Abdominal surgery     Family History: No family history on file. Social History:  History  Alcohol Use  . Yes    Comment: patient drinks beer and wine daily     History  Drug Use  . Yes  . Special: "Crack" cocaine    Comment: crack use daily    History   Social History  . Marital Status: Single    Spouse Name: N/A  . Number of Children: N/A  . Years of Education: N/A   Social History Main Topics  . Smoking status: Current Every Day Smoker -- 0.00 packs/day    Types: Cigarettes  . Smokeless tobacco: Not on file  . Alcohol Use: Yes     Comment: patient drinks beer and wine daily  . Drug Use: Yes    Special: "Crack" cocaine     Comment: crack use daily  . Sexual Activity: Not on file   Other Topics Concern  . None   Social History Narrative   Additional Social History:    Pain Medications: Pt denies abuse.  Prescriptions: Pt denies abuse.  Over the Counter: Pt denies abuse. History of alcohol / drug use?: Yes Longest period of sobriety (when/how long): UTA Name of Substance 1: ETOH 1 - Age of First Use: 16 1 - Amount (size/oz): "As much as I can get" 1 - Frequency: "As often as  I can" 1 - Duration: unknown, episodic use reported 1 - Last Use / Amount: 12-28-14 Name of Substance 2: Crack  2 - Age of First Use: unknown  2 - Amount (size/oz): "A whole lot" 2 - Frequency: "As often as I can get it"  2 - Duration: unknown  2 - Last Use / Amount: 12-28-14    Allergies:  No Known Allergies  Labs:  Results for orders placed or performed during the hospital encounter of 12/28/14 (from the past 48 hour(s))  Urine rapid drug screen (hosp performed)     Status: Abnormal   Collection Time: 12/29/14  3:03 PM  Result Value Ref Range   Opiates NONE DETECTED NONE DETECTED    Cocaine POSITIVE (A) NONE DETECTED   Benzodiazepines NONE DETECTED NONE DETECTED   Amphetamines NONE DETECTED NONE DETECTED   Tetrahydrocannabinol NONE DETECTED NONE DETECTED   Barbiturates NONE DETECTED NONE DETECTED    Comment:        DRUG SCREEN FOR MEDICAL PURPOSES ONLY.  IF CONFIRMATION IS NEEDED FOR ANY PURPOSE, NOTIFY LAB WITHIN 5 DAYS.        LOWEST DETECTABLE LIMITS FOR URINE DRUG SCREEN Drug Class       Cutoff (ng/mL) Amphetamine      1000 Barbiturate      200 Benzodiazepine   200 Tricyclics       300 Opiates          300 Cocaine          300 THC              50   Pregnancy, urine     Status: None   Collection Time: 12/29/14  4:21 PM  Result Value Ref Range   Preg Test, Ur NEGATIVE NEGATIVE    Comment:        THE SENSITIVITY OF THIS METHODOLOGY IS >20 mIU/mL.     Vitals: Blood pressure 127/72, pulse 47, temperature 98.1 F (36.7 C), temperature source Oral, resp. rate 18, last menstrual period 09/07/2012, SpO2 100 %.  Risk to Self: Suicidal Ideation: Yes-Currently Present Suicidal Intent: Yes-Currently Present Is patient at risk for suicide?: Yes Suicidal Plan?: Yes-Currently Present Specify Current Suicidal Plan: "Lay out in the middle of the road".  Access to Means: Yes Specify Access to Suicidal Means: Pt has access to traffic.  What has been your use of drugs/alcohol within the last 12 months?: Pt reported that she drinks beer and uses crack. How many times?: 1 Other Self Harm Risks: No other self harm risk identified at this time.  Triggers for Past Attempts: Unpredictable Intentional Self Injurious Behavior: None Risk to Others: Homicidal Ideation: Yes-Currently Present Thoughts of Harm to Others: Yes-Currently Present Comment - Thoughts of Harm to Others: "I'll hurt anybody". Current Homicidal Intent: Yes-Currently Present Current Homicidal Plan: No Describe Current Homicidal Plan: Pt did not report a plan.  Access to Homicidal Means:  No Identified Victim: "Whoever bothers me".  History of harm to others?: No Assessment of Violence: On admission Violent Behavior Description: No violent behaviors observed at this time. Pt is calm.  Does patient have access to weapons?: No Criminal Charges Pending?: Yes Describe Pending Criminal Charges: "Not paying a taxi cab driver". Does patient have a court date: Yes Court Date: 01/17/15 Prior Inpatient Therapy: Prior Inpatient Therapy: Yes Prior Therapy Dates: 2000-to present [facility]  Reason for Treatment: Schizophrenia  Prior Outpatient Therapy: Prior Outpatient Therapy: Yes Prior Therapy Dates: Current Prior Therapy  Facilty/Provider(s[facility] [phone] (Pt is unable to recall the name of the agency. ) Reason for Treatment: Schizophrenia   Current Facility-Administered Medications  Medication Dose Route Frequency Provider Last Rate Last Dose  . divalproex (DEPAKOTE) DR tablet 500 mg  500 mg Oral QHS Cathren Laine, MD   500 mg at 12/30/14 2109  . ibuprofen (ADVIL,MOTRIN) tablet 600 mg  600 mg Oral Q6H PRN Kristeen Mans, NP      . traZODone (DESYREL) tablet 100 mg  100 mg Oral QHS Cathren Laine, MD   100 mg at 12/30/14 2109   Current Outpatient Prescriptions  Medication Sig Dispense Refill  . divalproex (DEPAKOTE) 500 MG DR tablet Take 500 mg by mouth at bedtime.    . paliperidone (INVEGA) 6 MG 24 hr tablet Take 6 mg by mouth daily.    . Paliperidone Palmitate 234 MG/1.5ML SUSP Inject 1 Syringe into the muscle. Every four weeks.    . traZODone (DESYREL) 100 MG tablet Take 100 mg by mouth at bedtime.      Musculoskeletal: Strength & Muscle Tone: seen in bed covered up Gait & Station: lying in bed Patient leans: see above.  Psychiatric Specialty Exam:     Blood pressure 127/72, pulse 47, temperature 98.1 F (36.7 C), temperature source Oral, resp. rate 18, last menstrual period 09/07/2012, SpO2 100 %.There is no height  or weight on file to calculate BMI.  General Appearance: Fairly Groomed  Patent attorney::  Minimal  Speech:  Garbled and Slow  Volume:  Decreased  Mood:  Depressed  Affect:  Depressed and Flat  Thought Process:  Coherent, Goal Directed and Intact  Orientation:  Full (Time, Place, and Person)  Thought Content:  NA  Suicidal Thoughts:  Yes.  with intent/plan  Homicidal Thoughts:  Yes.  without intent/plan  Memory:  Immediate;   Good Recent;   Fair Remote;   Fair  Judgement:  Impaired  Insight:  Shallow  Psychomotor Activity:  Decreased and Psychomotor Retardation  Concentration:  Poor  Recall:  NA  Fund of Knowledge:Fair  Language: Good  Akathisia:  NA  Handed:  Right  AIMS (if indicated):     Assets:  Desire for Improvement  ADL's:  Intact  Cognition: Impaired,  Moderate  Sleep:      Medical Decision Making: Self-Limited or Minor (1), Review of Psycho-Social Stressors (1) and Established Problem, Worsening (2)  Treatment Plan Summary: Daily contact with patient to assess and evaluate symptoms and progress in treatment, Medication management and Plan re-evaluate in am for appropriate disposition.  Plan:  Recommend psychiatric Inpatient admission when medically cleared. Disposition: Seek inpatient treatment.   Kristeen Mans     12/30/2014 11:23 PM Patient seen face-to-face for psychiatric evaluation, chart reviewed and case discussed with the physician extender and developed treatment plan. Reviewed the information documented and agree with the treatment plan. Thedore Mins, MD

## 2014-12-30 NOTE — ED Notes (Signed)
Pt has been calm and cooperative. She has offered no questions or concerns. She denied pain/discomfort. Safety has been maintained with q15 minute observation. Will continue current POC

## 2014-12-30 NOTE — ED Notes (Signed)
Pt calm & resting at present, no distress noted, monitoring for safety, q 15 min checks in effect.

## 2014-12-30 NOTE — BH Assessment (Signed)
[facility] Assessment Progress Note  The following facilities have been contacted in an effort to place this pt, with results as noted:  Beds available, information faxed, decision pending:  [facility]  [facility]  Doylene Canninghomas Letta [place], [place] Triage Specialist 12/30/2014 @ 16:38

## 2014-12-31 DIAGNOSIS — F1994 Other psychoactive substance use, unspecified with psychoactive substance-induced mood disorder: Secondary | ICD-10-CM | POA: Diagnosis not present

## 2014-12-31 NOTE — [facility] Suicide Risk Assessment (Cosign Needed)
Suicide Risk Assessment  Discharge Assessment   [facility]   Demographic Factors:  Low socioeconomic status, Living alone, Unemployed and Has an ACT TEAM  Total Time spent with patient: 20 minutes  Musculoskeletal: Strength & Muscle Tone: within normal limits Gait & Station: normal Patient leans: N/A  Psychiatric Specialty Exam:     Blood pressure 119/65, pulse 67, temperature 98.9 F (37.2 C), temperature source Oral, resp. rate 16, last menstrual period 09/07/2012, SpO2 100 %.There is no height or weight on file to calculate BMI.  General Appearance: Casual and Fairly Groomed  Eye Contact::  Good  Speech:  Clear and Coherent and Normal Rate409  Volume:  Normal  Mood:  Depressed  Affect:  Congruent and Depressed  Thought Process:  Coherent, Goal Directed and Intact  Orientation:  Full (Time, Place, and Person)  Thought Content:  WDL  Suicidal Thoughts:  No  Homicidal Thoughts:  No  Memory:  Immediate;   Good Recent;   Good Remote;   Good  Judgement:  Good  Insight:  Good  Psychomotor Activity:  Normal  Concentration:  Good  Recall:  NA  Fund of Knowledge:Fair  Language: Good  Akathisia:  NA  Handed:  Right  AIMS (if indicated):     Assets:  Desire for Improvement  Sleep:     Cognition: WNL  ADL's:  Intact      Has this patient used any form of tobacco in the last 30 days? (Cigarettes, Smokeless Tobacco, Cigars, and/or Pipes) Yes, A prescription for an FDA-approved tobacco cessation medication was offered at discharge and the patient refused  Mental Status Per Nursing Assessment::   On Admission:     Current Mental Status by Physician: NA  Loss Factors: NA  Historical Factors: Prior suicide attempts  Risk Reduction Factors:   Religious beliefs about death, Positive social support and FOLLOWS act team  Continued Clinical Symptoms:  Depression:   Insomnia Schizophrenia:   Paranoid or undifferentiated type  Cognitive  Features That Contribute To Risk:  Polarized thinking    Suicide Risk:  Minimal: No identifiable suicidal ideation.  Patients presenting with no risk factors but with morbid ruminations; may be classified as minimal risk based on the severity of the depressive symptoms  Principal Problem: Substance induced mood disorder Discharge Diagnoses:  Patient Active Problem List   Diagnosis Date Noted  . Substance induced mood disorder [F19.94] 12/29/2014    Priority: High  . Psychosis [F29] 01/26/2014    Priority: High  . Hallucination [R44.3]   . Homicidal ideation [R45.850]   . Suicidal ideation [R45.851]   . Schizophrenia [F20.9] 01/26/2014  . Polysubstance abuse [F19.10] 01/26/2014      Plan Of Care/Follow-up recommendations:  Activity:  As tolerated Diet:  Regular  Is patient on multiple antipsychotic therapies at discharge:  No   Has Patient had three or more failed trials of antipsychotic monotherapy by history:  No  Recommended Plan for Multiple Antipsychotic Therapies: NA    [name], Julee Stoll C    PMHNP-BC  12/31/2014, 12:27 PM

## 2014-12-31 NOTE — ED Notes (Signed)
Pt sleeping at present, no distress noted, will monitor for safety, q 15 min checks in effect.

## 2014-12-31 NOTE — Consult Note (Signed)
[facility] Face-to-Face Psychiatry Consult   Reason for Consult: Substance induced mood disorder Referring Physician:  EDP Patient Identification: [name] MRN:  [id number] Principal Diagnosis: Substance induced mood disorder Diagnosis:   Patient Active Problem List   Diagnosis Date Noted  . Substance induced mood disorder [F19.94] 12/29/2014    Priority: High  . Psychosis [F29] 01/26/2014    Priority: High  . Hallucination [R44.3]   . Homicidal ideation [R45.850]   . Suicidal ideation [R45.851]   . Schizophrenia [F20.9] 01/26/2014  . Polysubstance abuse [F19.10] 01/26/2014    Total Time spent with patient: 25 minutes  Subjective:   [name] is a 54 y.o. female patient admitted with auditory hallucination, substance induced mood disorder.  HPI: AA female, 54 years old was evaluated this morning by Dr. Jannifer Franklin and Drenda Freeze, NP.  She reports her sleep is not good and "I feel like people are trying to kill me." She endorses suicidal ideation with a plan "to cut my wrist." She reports she had a previous attempt "the other night when I was going to take a bunch of pills" but states that she stopped herself. She endorses homicidal ideation without intent or plan towards "family who do me wrong." She denies auditory or visual hallucinations.  She report drinking alcohol "as much as I can get." She states she has tumultuous relationship with her boyfriend "[name]" and states he is trying to kill me.  She reports she was hospitalized and [facility] earlier this year. suicidal ideation and homicidal ideation.  Patient reported that she is using unknown amount of cocaine daily and that after using she feels" terrible"     Reviewed above note with updates.  Patient was evaluated this morning awake, alert and oriented x3.  She denied SI/HI/AVH but stated that she does not and will not allow her boy friend come back to her apartment.  She stated that she is trying to stop using Cocaine  but her boyfriend brings people home that are selling "Dope"   Patient is ready to leave and have been visible seen in the unit walking to and from the bathroom.  We will discharge patient now and her ACT team will pick up around 2 pm.  HPI Elements:   Location:  Substance induced mood disorder, homicidal ideation, suicide ideation. Quality:  severe, . Severity:  severe. Timing:  acute. Duration:  chronic mental illness and substance use disorder. Context:  Seeking treatment for substance use disorder..  Past Medical History:  Past Medical History  Diagnosis Date  . Schizophrenia   . Schizoaffective disorder, bipolar type   . Cocaine abuse   . Alcohol abuse     Past Surgical History  Procedure Laterality Date  . Abdominal surgery     Family History: No family history on file. Social History:  History  Alcohol Use  . Yes    Comment: patient drinks beer and wine daily     History  Drug Use  . Yes  . Special: "Crack" cocaine    Comment: crack use daily    History   Social History  . Marital Status: Single    Spouse Name: N/A  . Number of Children: N/A  . Years of Education: N/A   Social History Main Topics  . Smoking status: Current Every Day Smoker -- 0.00 packs/day    Types: Cigarettes  . Smokeless tobacco: Not on file  . Alcohol Use: Yes     Comment: patient drinks beer and  wine daily  . Drug Use: Yes    Special: "Crack" cocaine     Comment: crack use daily  . Sexual Activity: Not on file   Other Topics Concern  . None   Social History Narrative   Additional Social History:    Pain Medications: Pt denies abuse.  Prescriptions: Pt denies abuse.  Over the Counter: Pt denies abuse. History of alcohol / drug use?: Yes Longest period of sobriety (when/how long): UTA Name of Substance 1: ETOH 1 - Age of First Use: 16 1 - Amount (size/oz): "As much as I can get" 1 - Frequency: "As often as I can" 1 - Duration: unknown, episodic use reported 1 - Last Use  / Amount: 12-28-14 Name of Substance 2: Crack  2 - Age of First Use: unknown  2 - Amount (size/oz): "A whole lot" 2 - Frequency: "As often as I can get it"  2 - Duration: unknown  2 - Last Use / Amount: 12-28-14    Allergies:  No Known Allergies  Labs:  Results for orders placed or performed during the hospital encounter of 12/28/14 (from the past 48 hour(s))  Urine rapid drug screen (hosp performed)     Status: Abnormal   Collection Time: 12/29/14  3:03 PM  Result Value Ref Range   Opiates NONE DETECTED NONE DETECTED   Cocaine POSITIVE (A) NONE DETECTED   Benzodiazepines NONE DETECTED NONE DETECTED   Amphetamines NONE DETECTED NONE DETECTED   Tetrahydrocannabinol NONE DETECTED NONE DETECTED   Barbiturates NONE DETECTED NONE DETECTED    Comment:        DRUG SCREEN FOR MEDICAL PURPOSES ONLY.  IF CONFIRMATION IS NEEDED FOR ANY PURPOSE, NOTIFY LAB WITHIN 5 DAYS.        LOWEST DETECTABLE LIMITS FOR URINE DRUG SCREEN Drug Class       Cutoff (ng/mL) Amphetamine      1000 Barbiturate      200 Benzodiazepine   200 Tricyclics       300 Opiates          300 Cocaine          300 THC              50   Pregnancy, urine     Status: None   Collection Time: 12/29/14  4:21 PM  Result Value Ref Range   Preg Test, Ur NEGATIVE NEGATIVE    Comment:        THE SENSITIVITY OF THIS METHODOLOGY IS >20 mIU/mL.     Vitals: Blood pressure 127/72, pulse 47, temperature 98.1 F (36.7 C), temperature source Oral, resp. rate 18, last menstrual period 09/07/2012, SpO2 100 %.  Risk to Self: Suicidal Ideation: Yes-Currently Present Suicidal Intent: Yes-Currently Present Is patient at risk for suicide?: Yes Suicidal Plan?: Yes-Currently Present Specify Current Suicidal Plan: "Lay out in the middle of the road".  Access to Means: Yes Specify Access to Suicidal Means: Pt has access to traffic.  What has been your use of drugs/alcohol within the last 12 months?: Pt reported that she drinks beer and  uses crack. How many times?: 1 Other Self Harm Risks: No other self harm risk identified at this time.  Triggers for Past Attempts: Unpredictable Intentional Self Injurious Behavior: None Risk to Others: Homicidal Ideation: Yes-Currently Present Thoughts of Harm to Others: Yes-Currently Present Comment - Thoughts of Harm to Others: "I'll hurt anybody". Current Homicidal Intent: Yes-Currently Present Current Homicidal Plan: No Describe Current Homicidal Plan: Pt did not  report a plan.  Access to Homicidal Means: No Identified Victim: "Whoever bothers me".  History of harm to others?: No Assessment of Violence: On admission Violent Behavior Description: No violent behaviors observed at this time. Pt is calm.  Does patient have access to weapons?: No Criminal Charges Pending?: Yes Describe Pending Criminal Charges: "Not paying a taxi cab driver". Does patient have a court date: Yes Court Date: 01/17/15 Prior Inpatient Therapy: Prior Inpatient Therapy: Yes Prior Therapy Dates: 2000-to present Prior Therapy Facilty/Provider(s): [facility], [facility], [facility]  Reason for Treatment: Schizophrenia  Prior Outpatient Therapy: Prior Outpatient Therapy: Yes Prior Therapy Dates: Current Prior Therapy Facilty/Provider(s): ACT [facility] [phone] (Pt is unable to recall the name of the agency. ) Reason for Treatment: Schizophrenia   Current Facility-Administered Medications  Medication Dose Route Frequency Provider Last Rate Last Dose  . divalproex (DEPAKOTE) DR tablet 500 mg  500 mg Oral QHS Cathren Laine, MD   500 mg at 12/30/14 2109  . ibuprofen (ADVIL,MOTRIN) tablet 600 mg  600 mg Oral Q6H PRN Kristeen Mans, NP   600 mg at 12/31/14 0981  . traZODone (DESYREL) tablet 100 mg  100 mg Oral QHS Cathren Laine, MD   100 mg at 12/30/14 2109   Current Outpatient Prescriptions  Medication Sig Dispense Refill  . divalproex (DEPAKOTE) 500 MG DR tablet Take 500 mg by mouth at bedtime.    . paliperidone (INVEGA) 6  MG 24 hr tablet Take 6 mg by mouth daily.    . Paliperidone Palmitate 234 MG/1.5ML SUSP Inject 1 Syringe into the muscle. Every four weeks.    . traZODone (DESYREL) 100 MG tablet Take 100 mg by mouth at bedtime.      Musculoskeletal: Strength & Muscle Tone: within normal limits Gait & Station: normal Patient leans: N/A  Psychiatric Specialty Exam:     Blood pressure 127/72, pulse 47, temperature 98.1 F (36.7 C), temperature source Oral, resp. rate 18, last menstrual period 09/07/2012, SpO2 100 %.There is no height or weight on file to calculate BMI.  General Appearance: Fairly Groomed  Patent attorney::  Good  Speech:  Clear and Coherent  Volume:  Normal  Mood:  Depressed  Affect:  Depressed  Thought Process:  Coherent, Goal Directed and Intact  Orientation:  Full (Time, Place, and Person)  Thought Content:  NA  Suicidal Thoughts:  No  Homicidal Thoughts:  No  Memory:  Immediate;   Good Recent;   Fair Remote;   Fair  Judgement:  Fair  Insight:  Shallow  Psychomotor Activity:  Normal  Concentration:  Fair  Recall:  NA  Fund of Knowledge:Fair  Language: Good  Akathisia:  NA  Handed:  Right  AIMS (if indicated):     Assets:  Desire for Improvement  ADL's:  Intact  Cognition: WNL  Sleep:      Medical Decision Making: Self-Limited or Minor (1), Review of Psycho-Social Stressors (1) and Established Problem, Worsening (2)  Treatment Plan Summary: Discharge home  Plan:  No evidence of imminent risk to self or others at present.   Discharge home, ACT team to assume care Disposition:  Discharge back home, to be picked up by her ACT team   Earney Navy    PMHNP-BC 12/31/2014 12:14 PM

## 2014-12-31 NOTE — Progress Notes (Signed)
Per psychiatrist, patient psychiatrically stable for discharge home and to follow up with [facility] actt team. CSW spoke with actt team who states they will be here at approximately 230pm to pick up patient. RN informed.   Olga CoasterKristen Chakira Jachim, LCSW  Clinical Social Work  [facility] [phone]

## 2014-12-31 NOTE — ED Notes (Addendum)
Has been anxious, but generally re-directable. Cooperative with assessment. Denies SI/HI/VH and contracts for safety. Endorsed AH but states the voices are low and not bothering her.  Requested prn for pain and reported good relief upon awakening. States she is eager for discharge. Support and encouragement provided. Safety has been maintained with q15 minute observation. Will continue current POC

## 2014-12-31 NOTE — ED Notes (Signed)
Pt resting quietly in bed with eyes closed. Respirations are even and unlabored. No acute distress noted. Safety has been maintained with q15 minute observation. Will continue current POC 

## 2015-01-05 ENCOUNTER — Emergency Department (HOSPITAL_COMMUNITY)
Admission: EM | Admit: 2015-01-05 | Discharge: 2015-01-05 | Disposition: A | Discharge status: Home / Self Care | Payer: Medicaid Other | Attending: Emergency Medicine | Admitting: Emergency Medicine

## 2015-01-05 ENCOUNTER — Encounter (HOSPITAL_COMMUNITY): Payer: Self-pay | Admitting: Oncology

## 2015-01-05 DIAGNOSIS — R45851 Suicidal ideations: Secondary | ICD-10-CM

## 2015-01-05 DIAGNOSIS — F1910 Other psychoactive substance abuse, uncomplicated: Secondary | ICD-10-CM | POA: Diagnosis not present

## 2015-01-05 DIAGNOSIS — Z720 Tobacco use: Secondary | ICD-10-CM | POA: Diagnosis not present

## 2015-01-05 DIAGNOSIS — Z79899 Other long term (current) drug therapy: Secondary | ICD-10-CM | POA: Insufficient documentation

## 2015-01-05 DIAGNOSIS — F209 Schizophrenia, unspecified: Secondary | ICD-10-CM | POA: Insufficient documentation

## 2015-01-05 DIAGNOSIS — Z008 Encounter for other general examination: Secondary | ICD-10-CM | POA: Diagnosis present

## 2015-01-05 LAB — CBC
HCT: 37.9 % (ref 36.0–46.0)
Hemoglobin: 12.8 g/dL (ref 12.0–15.0)
MCH: 31.6 pg (ref 26.0–34.0)
MCHC: 33.8 g/dL (ref 30.0–36.0)
MCV: 93.6 fL (ref 78.0–100.0)
Platelets: 304 10*3/uL (ref 150–400)
RBC: 4.05 MIL/uL (ref 3.87–5.11)
RDW: 13.4 % (ref 11.5–15.5)
WBC: 8.4 10*3/uL (ref 4.0–10.5)

## 2015-01-05 LAB — COMPREHENSIVE METABOLIC PANEL
ALT: 13 U/L (ref 0–35)
AST: 15 U/L (ref 0–37)
Albumin: 4.4 g/dL (ref 3.5–5.2)
Alkaline Phosphatase: 63 U/L (ref 39–117)
Anion gap: 9 (ref 5–15)
BUN: 6 mg/dL (ref 6–23)
CO2: 29 mmol/L (ref 19–32)
Calcium: 9.6 mg/dL (ref 8.4–10.5)
Chloride: 102 mmol/L (ref 96–112)
Creatinine, Ser: 0.69 mg/dL (ref 0.50–1.10)
GFR calc non Af Amer: >90 mL/min (ref >90)
GLUCOSE: 86 mg/dL (ref 70–99)
Potassium: 4.1 mmol/L (ref 3.5–5.1)
Sodium: 140 mmol/L (ref 135–145)
Total Bilirubin: 0.4 mg/dL (ref 0.3–1.2)
Total Protein: 7.8 g/dL (ref 6.0–8.3)

## 2015-01-05 LAB — ACETAMINOPHEN LEVEL: Acetaminophen (Tylenol), Serum: <10 ug/mL — ABNORMAL LOW (ref 10–30)

## 2015-01-05 LAB — ETHANOL: Alcohol, Ethyl (B): <5 mg/dL (ref 0–9)

## 2015-01-05 LAB — SALICYLATE LEVEL

## 2015-01-05 MED ORDER — TRAZODONE HCL 100 MG PO TABS: 100.0000 mg | ORAL_TABLET | Freq: Every day | ORAL | Status: DC

## 2015-01-05 MED ORDER — PALIPERIDONE ER 6 MG PO TB24
6.0000 mg | ORAL_TABLET | Freq: Every day | ORAL | Status: DC
  Administered 2015-01-05: 6 mg via ORAL
  Filled 2015-01-05: qty 1

## 2015-01-05 MED ORDER — ONDANSETRON HCL 4 MG PO TABS: 4.0000 mg | ORAL_TABLET | Freq: Three times a day (TID) | ORAL | Status: DC | PRN

## 2015-01-05 MED ORDER — IBUPROFEN 200 MG PO TABS: 600.0000 mg | ORAL_TABLET | Freq: Three times a day (TID) | ORAL | Status: DC | PRN

## 2015-01-05 MED ORDER — DIVALPROEX SODIUM 500 MG PO DR TAB: 500.0000 mg | DELAYED_RELEASE_TABLET | Freq: Every day | ORAL | Status: DC

## 2015-01-05 MED ORDER — ALUM & MAG HYDROXIDE-SIMETH 200-200-20 MG/5ML PO SUSP: 30.0000 mL | ORAL | Status: DC | PRN

## 2015-01-05 MED ORDER — ACETAMINOPHEN 325 MG PO TABS: 650.0000 mg | ORAL_TABLET | ORAL | Status: DC | PRN

## 2015-01-05 NOTE — [facility] Suicide Risk Assessment (Signed)
Suicide Risk Assessment  Discharge Assessment   [facility]   Demographic Factors:  NA  Total Time spent with patient: 45 minutes  Musculoskeletal: Strength & Muscle Tone: within normal limits Gait & Station: normal Patient leans: N/A  Psychiatric Specialty Exam:     Blood pressure 142/102, pulse 76, temperature 97.6 F (36.4 C), temperature source Oral, resp. rate 17, height 5\' 11"  (1.803 m), weight 200 lb (90.719 kg), last menstrual period 09/07/2012, SpO2 100 %.Body mass index is 27.91 kg/(m^2).  General Appearance: Casual  Eye Contact::  Good  Speech:  Normal Rate  Volume:  Normal  Mood:  Irritable  Affect:  Flat  Thought Process:  Coherent  Orientation:  Full (Time, Place, and Person)  Thought Content:  WDL  Suicidal Thoughts:  No  Homicidal Thoughts:  No  Memory:  Immediate;   Good Recent;   Good Remote;   Good  Judgement:  Fair  Insight:  Fair  Psychomotor Activity:  Normal  Concentration:  Good  Recall:  Good  Fund of Knowledge:Good  Language: Good  Akathisia:  No  Handed:  Right  AIMS (if indicated):     Assets:  Housing Leisure Time Physical Health Resilience Social Support  ADL's:  Intact  Cognition: WNL  Sleep:      Has this patient used any form of tobacco in the last 30 days? (Cigarettes, Smokeless Tobacco, Cigars, and/or Pipes) Yes, A prescription for an FDA-approved tobacco cessation medication was offered at discharge and the patient refused  Mental Status Per Nursing Assessment::   On Admission:   Cocaine abuse with hallucinations  Current Mental Status by Physician: NA  Loss Factors: NA  Historical Factors: NA  Risk Reduction Factors:   Sense of responsibility to family, Living with another person, especially a relative, Positive social support and Positive therapeutic relationship  Continued Clinical Symptoms:  Irritability  Cognitive Features That Contribute To Risk:  None    Suicide Risk:   Minimal: No identifiable suicidal ideation.  Patients presenting with no risk factors but with morbid ruminations; may be classified as minimal risk based on the severity of the depressive symptoms  Principal Problem: Schizophrenia Discharge Diagnoses:  Patient Active Problem List   Diagnosis Date Noted  . Schizophrenia [F20.9] 01/26/2014    Priority: High  . Polysubstance abuse [F19.10] 01/26/2014    Priority: High  . Psychosis [F29] 01/26/2014    Priority: High  . Substance induced mood disorder [F19.94] 12/29/2014  . Hallucination [R44.3]   . Homicidal ideation [R45.850]   . Suicidal ideation [R45.851]       Plan Of Care/Follow-up recommendations:  Activity:  as tolerated Diet:  heart healthy diet  Is patient on multiple antipsychotic therapies at discharge:  No   Has Patient had three or more failed trials of antipsychotic monotherapy by history:  No  Recommended Plan for Multiple Antipsychotic Therapies: NA    Vickee Mormino, PMH-NP 01/05/2015, 1:48 PM

## 2015-01-05 NOTE — ED Notes (Signed)
Patient reports that she does not take any of her psych medications

## 2015-01-05 NOTE — ED Notes (Signed)
Patient refusing to have labs drawn

## 2015-01-05 NOTE — ED Notes (Signed)
Patient here in ED due to AV hallucinations Patient denies SI/HI Patient with significant psych history

## 2015-01-05 NOTE — Consult Note (Signed)
[place] [facility]iatry Consult   Reason for Consult:  Hallucinations Referring Physician:  EDP Patient Identification: [name] MRN:  [id number] Principal Diagnosis: Schizophrenia Diagnosis:   Patient Active Problem List   Diagnosis Date Noted  . Schizophrenia [F20.9] 01/26/2014    Priority: High  . Polysubstance abuse [F19.10] 01/26/2014    Priority: High  . Psychosis [F29] 01/26/2014    Priority: High  . Substance induced mood disorder [F19.94] 12/29/2014  . Hallucination [R44.3]   . Homicidal ideation [R45.850]   . Suicidal ideation [R45.851]     Total Time spent with patient: 45 minutes  Subjective:   [name] is a 54 y.o. female patient does not warrant admission.  HPI:  The patient was abusing cocaine last night and started having hallucinations.  Today, she is clear and coherent.  Denies hallucinations, suicidal/homicidal ideations.  She does not want any assistance for her drug issues, "I want to go home."  Kanyah lives with her boyfriend and is established in out-patient therapy already.  Stable for discharge.   HPI Elements:   Location:  generalized. Quality:  acute. Severity:  mild. Timing:  intermittent. Duration:  brief. Context:  cocaine.  Past Medical History:  Past Medical History  Diagnosis Date  . Schizophrenia   . Schizoaffective disorder, bipolar type   . Cocaine abuse   . Alcohol abuse     Past Surgical History  Procedure Laterality Date  . Abdominal surgery     Family History: History reviewed. No pertinent family history. Social History:  History  Alcohol Use  . Yes    Comment: patient drinks beer and wine daily     History  Drug Use  . Yes  . Special: "Crack" cocaine    Comment: crack use daily    History   Social History  . Marital Status: Single    Spouse Name: N/A  . Number of Children: N/A  . Years of Education: N/A   Social History Main Topics  . Smoking status: Current Every Day Smoker -- 0.00  packs/day    Types: Cigarettes  . Smokeless tobacco: Not on file  . Alcohol Use: Yes     Comment: patient drinks beer and wine daily  . Drug Use: Yes    Special: "Crack" cocaine     Comment: crack use daily  . Sexual Activity: Not on file   Other Topics Concern  . None   Social History Narrative   Additional Social History:                          Allergies:  No Known Allergies  Labs:  Results for orders placed or performed during the hospital encounter of 01/05/15 (from the past 48 hour(s))  Acetaminophen level     Status: Abnormal   Collection Time: 01/05/15  7:36 AM  Result Value Ref Range   Acetaminophen (Tylenol), Serum <10.0 (L) 10 - 30 ug/mL    Comment:        THERAPEUTIC CONCENTRATIONS VARY SIGNIFICANTLY. A RANGE OF 10-30 ug/mL MAY BE AN EFFECTIVE CONCENTRATION FOR MANY PATIENTS. HOWEVER, SOME ARE BEST TREATED AT CONCENTRATIONS OUTSIDE THIS RANGE. ACETAMINOPHEN CONCENTRATIONS >150 ug/mL AT 4 HOURS AFTER INGESTION AND >50 ug/mL AT 12 HOURS AFTER INGESTION ARE OFTEN ASSOCIATED WITH TOXIC REACTIONS.   CBC     Status: None   Collection Time: 01/05/15  7:36 AM  Result Value Ref Range   WBC 8.4 4.0 - 10.5 K/uL  RBC 4.05 3.87 - 5.11 MIL/uL   Hemoglobin 12.8 12.0 - 15.0 g/dL   HCT 37.9 36.0 - 46.0 %   MCV 93.6 78.0 - 100.0 fL   MCH 31.6 26.0 - 34.0 pg   MCHC 33.8 30.0 - 36.0 g/dL   RDW 13.4 11.5 - 15.5 %   Platelets 304 150 - 400 K/uL  Comprehensive metabolic panel     Status: None   Collection Time: 01/05/15  7:36 AM  Result Value Ref Range   Sodium 140 135 - 145 mmol/L   Potassium 4.1 3.5 - 5.1 mmol/L   Chloride 102 96 - 112 mmol/L   CO2 29 19 - 32 mmol/L   Glucose, Bld 86 70 - 99 mg/dL   BUN 6 6 - 23 mg/dL   Creatinine, Ser 0.69 0.50 - 1.10 mg/dL   Calcium 9.6 8.4 - 10.5 mg/dL   Total Protein 7.8 6.0 - 8.3 g/dL   Albumin 4.4 3.5 - 5.2 g/dL   AST 15 0 - 37 U/L   ALT 13 0 - 35 U/L   Alkaline Phosphatase 63 39 - 117 U/L   Total  Bilirubin 0.4 0.3 - 1.2 mg/dL   GFR calc non Af Amer >90 >90 mL/min   GFR calc Af Amer >90 >90 mL/min    Comment: (NOTE) The eGFR has been calculated using the CKD EPI equation. This calculation has not been validated in all clinical situations. eGFR's persistently <90 mL/min signify possible Chronic Kidney Disease.    Anion gap 9 5 - 15  Ethanol (ETOH)     Status: None   Collection Time: 01/05/15  7:36 AM  Result Value Ref Range   Alcohol, Ethyl (B) <5 0 - 9 mg/dL    Comment:        LOWEST DETECTABLE LIMIT FOR SERUM ALCOHOL IS 11 mg/dL FOR MEDICAL PURPOSES ONLY   Salicylate level     Status: None   Collection Time: 01/05/15  7:36 AM  Result Value Ref Range   Salicylate Lvl <4.1 2.8 - 20.0 mg/dL    Vitals: Blood pressure 142/102, pulse 76, temperature 97.6 F (36.4 C), temperature source Oral, resp. rate 17, height 5' 11"  (1.803 m), weight 200 lb (90.719 kg), last menstrual period 09/07/2012, SpO2 100 %.  Risk to Self: Suicidal Ideation: No Suicidal Intent: No Is patient at risk for suicide?: No Suicidal Plan?: No Specify Current Suicidal Plan: na Access to Means: No Specify Access to Suicidal Means: na What has been your use of drugs/alcohol within the last 12 months?: refused to answer How many times?: 1 Other Self Harm Risks: na Triggers for Past Attempts: Unknown, Unpredictable, Hallucinations Intentional Self Injurious Behavior: None Risk to Others: Homicidal Ideation: No Thoughts of Harm to Others: No Comment - Thoughts of Harm to Others: na Current Homicidal Intent: No Current Homicidal Plan: No Describe Current Homicidal Plan: na Access to Homicidal Means: No Identified Victim: na History of harm to others?: Yes Assessment of Violence: In past 6-12 months Criminal Charges Pending?: Yes Does patient have a court date: Yes Prior Inpatient Therapy: Prior Inpatient Therapy: Yes Prior Therapy Dates: 2000-to present Prior Therapy Facilty/Provider(s): [facility], [place],  [place]  Reason for Treatment: Schizophrenia  Prior Outpatient Therapy: Prior Outpatient Therapy: Yes Prior Therapy Dates: Current Prior Therapy Facilty/Provider([facility]): [facility] [phone] Reason for Treatment: Schizophrenia   Current Facility-Administered Medications  Medication Dose Route Frequency Provider Last Rate Last Dose  . acetaminophen (TYLENOL) tablet 650 mg  650 mg Oral Q4H  PRN Dalia Heading, PA-C      . alum & mag hydroxide-simeth (MAALOX/MYLANTA) 200-200-20 MG/5ML suspension 30 mL  30 mL Oral PRN Dalia Heading, PA-C      . divalproex (DEPAKOTE) DR tablet 500 mg  500 mg Oral QHS Patrecia Pour, NP      . ibuprofen (ADVIL,MOTRIN) tablet 600 mg  600 mg Oral Q8H PRN Dalia Heading, PA-C      . ondansetron [facility]) tablet 4 mg  4 mg Oral Q8H PRN Dalia Heading, PA-C      . paliperidone (INVEGA) 24 hr tablet 6 mg  6 mg Oral Daily Patrecia Pour, NP      . traZODone (DESYREL) tablet 100 mg  100 mg Oral QHS Patrecia Pour, NP       Current Outpatient Prescriptions  Medication Sig Dispense Refill  . divalproex (DEPAKOTE) 500 MG DR tablet Take 500 mg by mouth at bedtime.    . paliperidone (INVEGA) 6 MG 24 hr tablet Take 6 mg by mouth daily.    . Paliperidone Palmitate 234 MG/1.5ML SUSP Inject 1 Syringe into the muscle. Every four weeks.    . traZODone (DESYREL) 100 MG tablet Take 100 mg by mouth at bedtime.      Musculoskeletal: Strength & Muscle Tone: within normal limits Gait & Station: normal Patient leans: N/A  Psychiatric Specialty Exam:     Blood pressure 142/102, pulse 76, temperature 97.6 F (36.4 C), temperature source Oral, resp. rate 17, height 5' 11"  (1.803 m), weight 200 lb (90.719 kg), last menstrual period 09/07/2012, SpO2 100 %.Body mass index is 27.91 kg/(m^2).  General Appearance: Casual  Eye Contact::  Good  Speech:  Normal Rate  Volume:  Normal  Mood:  Irritable  Affect:  Flat  Thought Process:  Coherent  Orientation:  Full  (Time, Place, and Person)  Thought Content:  WDL  Suicidal Thoughts:  No  Homicidal Thoughts:  No  Memory:  Immediate;   Good Recent;   Good Remote;   Good  Judgement:  Fair  Insight:  Fair  Psychomotor Activity:  Normal  Concentration:  Good  Recall:  Good  Fund of Knowledge:Good  Language: Good  Akathisia:  No  Handed:  Right  AIMS (if indicated):     Assets:  Housing Leisure Time Physical Health Resilience Social Support  ADL's:  Intact  Cognition: WNL  Sleep:      Medical Decision Making: Review of Psycho-Social Stressors (1), Review or order clinical lab tests (1) and Review of Medication Regimen & Side Effects (2)  Treatment Plan Summary: Daily contact with patient to assess and evaluate symptoms and progress in treatment, Medication management and Plan discharge and follow-up with her regular providers in out-patient  Plan:  No evidence of imminent risk to self or others at present.   Disposition: Discharge and follow-up with her regular providers in out-patient  Waylan Boga, [place] 01/05/2015 1:38 PM Patient seen face-to-face for psychiatric evaluation, chart reviewed and case discussed with the physician extender and developed treatment plan. Reviewed the information documented and agree with the treatment plan. Corena Pilgrim, MD

## 2015-01-05 NOTE — ED Notes (Addendum)
Pt was taken to her room and given a meal which she refused. Pt mumbled that she is really tired. Pt was provided with a blanket and toiletrees. She does contract for safety the patient appears limited and will not tell the nurse why she is here. Pt was given a bus pass. She continued to say,"get me out of here so I can go see Leonette Mostharles. Give me my clothes." Pt slept most of the time she was here and would not eat her lunch. She was cooperative taking her medication for the nurse.

## 2015-01-05 NOTE — ED Provider Notes (Signed)
CSN: [id number]     Arrival date & time 01/05/15  0543 History   First MD Initiated Contact with Patient 01/05/15 0636     Chief Complaint  Patient presents with  . Medical Clearance     (Consider location/radiation/quality/duration/timing/severity/associated sxs/prior Treatment) HPI Patient presents to the emergency department stating that a white mouse is trying to kill her.  Patient states that she lives with a white mouse in her house and she states that it is going to harm her.  The patient states that there are other mice as well.  During the middle of her conversation the patient stops answering my questions Past Medical History  Diagnosis Date  . Schizophrenia   . Schizoaffective disorder, bipolar type   . Cocaine abuse   . Alcohol abuse    Past Surgical History  Procedure Laterality Date  . Abdominal surgery     History reviewed. No pertinent family history. History  Substance Use Topics  . Smoking status: Current Every Day Smoker -- 0.00 packs/day    Types: Cigarettes  . Smokeless tobacco: Not on file  . Alcohol Use: Yes     Comment: patient drinks beer and wine daily   OB History    No data available     Review of Systems  Level V caveat applies due to uncooperativeness and mental illness  Allergies  Review of patient's allergies indicates no known allergies.  Home Medications   Prior to Admission medications   Medication Sig Start Date End Date Taking? Authorizing Provider  divalproex (DEPAKOTE) 500 MG DR tablet Take 500 mg by mouth at bedtime.    Historical Provider, MD  paliperidone (INVEGA) 6 MG 24 hr tablet Take 6 mg by mouth daily.    Historical Provider, MD  Paliperidone Palmitate 234 MG/1.5ML SUSP Inject 1 Syringe into the muscle. Every four weeks.    Historical Provider, MD  traZODone (DESYREL) 100 MG tablet Take 100 mg by mouth at bedtime.    Historical Provider, MD   BP 155/70 mmHg  Pulse 74  Temp(Src) 97.4 F (36.3 C) (Oral)  Resp 16  Ht  5\' 11"  (1.803 m)  Wt 200 lb (90.719 kg)  BMI 27.91 kg/m2  SpO2 100%  LMP 09/07/2012 Physical Exam  Constitutional: She appears well-developed and well-nourished. No distress.  HENT:  Head: Normocephalic and atraumatic.  Eyes: Pupils are equal, round, and reactive to light.  Cardiovascular: Normal rate, regular rhythm and normal heart sounds.   Pulmonary/Chest: Effort normal and breath sounds normal. No respiratory distress.  Neurological: She is alert.  Skin: Skin is warm and dry. No rash noted.  Psychiatric: Her mood appears not anxious. Her affect is not angry, not blunt, not labile and not inappropriate. She is actively hallucinating. She is not agitated and not aggressive. Thought content is paranoid and delusional. Cognition and memory are impaired. She does not exhibit a depressed mood. She expresses no homicidal and no suicidal ideation. She expresses no suicidal plans and no homicidal plans.  Nursing note and vitals reviewed.   ED Course  Procedures (including critical care time) Labs Review Labs Reviewed  ACETAMINOPHEN LEVEL  CBC  COMPREHENSIVE METABOLIC PANEL  ETHANOL  SALICYLATE LEVEL  URINE RAPID DRUG SCREEN (HOSP PERFORMED)    The patient is clearly having delusions and psychosis. will need TTS assessment   Charlestine NightChristopher Zaniya Mcaulay, PA-C 01/05/15 [phone]  Blake DivineJohn Wofford, MD 01/06/15 2352

## 2015-01-05 NOTE — BH Assessment (Signed)
Assessment Note  [name] is an 54 y.o. female. Patient refused to consistently participate with this assessment.  Patient reports arriving to the ED by EMS initiated by a neighbor per patient for unknown reasons.  Patient reports feeling unsafe a home with her boyfriend.  "People trying to kill me".  Patient endorse constant A/V hallucinations of people trying to kill her with a needles.  Patient reports [facility] and she is compliant with medications.  "I don't want to answer this anymore, what you asking me all these question for"?  Patient reports having a Child psychotherapistsocial worker by the name of Barnetta Hammersmithlaine Johnson.  According to previous assessments patient has an ACTT with [facility] [phone].     Disposition Pending.    Axis I: Chronic Paranoid Schizophrenia  Axis II: Deferred Axis III:  Past Medical History  Diagnosis Date  . Schizophrenia   . Schizoaffective disorder, bipolar type   . Cocaine abuse   . Alcohol abuse    Axis IV: economic problems, housing problems, other psychosocial or environmental problems, problems related to legal system/crime, problems related to social environment, problems with access to health care services and problems with primary support group Axis V: 21-30 behavior considerably influenced by delusions or hallucinations OR serious impairment in judgment, communication OR inability to function in almost all areas  Past Medical History:  Past Medical History  Diagnosis Date  . Schizophrenia   . Schizoaffective disorder, bipolar type   . Cocaine abuse   . Alcohol abuse     Past Surgical History  Procedure Laterality Date  . Abdominal surgery      Family History: History reviewed. No pertinent family history.  Social History:  reports that she has been smoking Cigarettes.  She has been smoking about 0.00 packs per day. She does not have any smokeless tobacco history on file. She  reports that she drinks alcohol. She reports that she uses illicit drugs ("Crack" cocaine).  Additional Social History:     CIWA: CIWA-Ar BP: 155/70 mmHg Pulse Rate: 74 COWS:    Allergies: No Known Allergies  Home Medications:  (Not in a hospital admission)  OB/GYN Status:  Patient's last menstrual period was 09/07/2012.  General Assessment Data Location of Assessment: [facility] ED ACT Assessment: Yes Is this a Tele or Face-to-Face Assessment?: Face-to-Face Is this an Initial Assessment or a Re-assessment for this encounter?: Initial Assessment Living Arrangements: Spouse/significant other Can pt return to current living arrangement?: Yes Admission Status: Voluntary Is patient capable of signing voluntary admission?: Yes Transfer from: Home Referral Source: Self/Family/Friend  Medical Screening Exam [facility] Walk[facility]) Medical Exam completed: Yes  [facility] Crisis [facility] Plan Living Arrangements: Spouse/significant other Name of Psychiatrist: reports has an [facility] team Name of Therapist: [facility] Amalia GreenhouseStephaie [phone]  Education Status Is patient currently in school?: No Highest grade of school patient has completed: 8411 Name of school: NA Contact person: NA  Risk to self with the past 6 months Suicidal Ideation: No Suicidal Intent: No Is patient at risk for suicide?: No Suicidal Plan?: No Specify Current Suicidal Plan: na Access to Means: No Specify Access to Suicidal Means: na What has been your use of drugs/alcohol within the last 12 months?: refused to answer Previous Attempts/Gestures: Yes How many times?: 1 Other Self Harm Risks: na Triggers for Past Attempts: Unknown, Unpredictable, Hallucinations Intentional Self Injurious Behavior: None Family Suicide History: Unknown Recent stressful life event(s): Conflict (Comment), Other (Comment) (Pt reports  people trying to kill her) Persecutory voices/beliefs?: Yes Depression: Yes Depression Symptoms: Feeling angry/irritable,  Loss of interest in usual pleasures, Guilt, Isolating Substance abuse history and/or treatment for substance abuse?: Yes  Risk to Others within the past 6 months Homicidal Ideation: No Thoughts of Harm to Others: No Comment - Thoughts of Harm to Others: na Current Homicidal Intent: No Current Homicidal Plan: No Describe Current Homicidal Plan: na Access to Homicidal Means: No Identified Victim: na History of harm to others?: Yes Assessment of Violence: In past 6-12 months Criminal Charges Pending?: Yes Does patient have a court date: Yes  Psychosis Hallucinations: Auditory, Visual Delusions: Somatic, Persecutory  Mental Status Report Appearance/Hygiene: In hospital gown Eye Contact: Poor Motor Activity: Freedom of movement Speech: Argumentative Level of Consciousness: Alert Mood: Labile Affect: Irritable, Labile, Preoccupied Anxiety Level: Moderate Thought Processes: Tangential Judgement: Impaired Orientation: Person, Place, Situation Obsessive Compulsive Thoughts/Behaviors: None  Cognitive Functioning Concentration: Poor Memory: Unable to Assess IQ: Average Insight: Poor Impulse Control: Poor Appetite:  (refused to answer) Sleep: Unable to Assess (refused to answer) Vegetative Symptoms: Unable to Assess  ADLScreening [facility] Assessment Services) Patient's cognitive ability adequate to safely complete daily activities?: Yes Patient able to express need for assistance with ADLs?: Yes Independently performs ADLs?: Yes (appropriate for developmental age)  Prior Inpatient Therapy Prior Inpatient Therapy: Yes Prior Therapy Dates: 2000-to present Prior Therapy Facilty/Provider([facility]): [facility], [facility], [facility]  Reason for Treatment: Schizophrenia   Prior Outpatient Therapy Prior Outpatient Therapy: Yes Prior Therapy Dates: Current Prior Therapy Facilty/Provider(s): [facility] [phone] Reason for Treatment: Schizophrenia   ADL Screening (condition at time of  admission) Patient's cognitive ability adequate to safely complete daily activities?: Yes Patient able to express need for assistance with ADLs?: Yes Independently performs ADLs?: Yes (appropriate for developmental age)             Advance Directives (For Healthcare) Does patient have an advance directive?: No Would patient like information on creating an advanced directive?: No - patient declined information    Additional Information 1:1 In Past 12 Months?: No CIRT Risk: No Elopement Risk: No Does patient have medical clearance?: Yes     Disposition:  Disposition Initial Assessment Completed for this Encounter: Yes Disposition of Patient: Other dispositions Other disposition(s): Other (Comment) (pending)  On Site Evaluation by:   Reviewed with Physician:    Maryelizabeth Rowan A 01/05/2015 8:20 AM

## 2015-01-05 NOTE — ED Notes (Signed)
Per EMS pt reported that she feels unsafe because the, "white mouse is trying to kill me."  Pt told EMS that she wants to go to a nursing home.

## 2015-01-06 ENCOUNTER — Emergency Department (HOSPITAL_COMMUNITY)
Admission: EM | Admit: 2015-01-06 | Discharge: 2015-01-06 | Disposition: A | Discharge status: Home / Self Care | Payer: Medicaid Other | Attending: Emergency Medicine | Admitting: Emergency Medicine

## 2015-01-06 ENCOUNTER — Encounter (HOSPITAL_COMMUNITY): Payer: Self-pay | Admitting: Emergency Medicine

## 2015-01-06 DIAGNOSIS — Z720 Tobacco use: Secondary | ICD-10-CM | POA: Insufficient documentation

## 2015-01-06 DIAGNOSIS — Z7289 Other problems related to lifestyle: Secondary | ICD-10-CM

## 2015-01-06 DIAGNOSIS — F1099 Alcohol use, unspecified with unspecified alcohol-induced disorder: Secondary | ICD-10-CM | POA: Diagnosis not present

## 2015-01-06 DIAGNOSIS — Z789 Other specified health status: Secondary | ICD-10-CM

## 2015-01-06 DIAGNOSIS — Z3202 Encounter for pregnancy test, result negative: Secondary | ICD-10-CM | POA: Insufficient documentation

## 2015-01-06 LAB — COMPREHENSIVE METABOLIC PANEL
ALBUMIN: 4 g/dL (ref 3.5–5.2)
ALT: 12 U/L (ref 0–35)
AST: 18 U/L (ref 0–37)
Alkaline Phosphatase: 56 U/L (ref 39–117)
Anion gap: 8 (ref 5–15)
BILIRUBIN TOTAL: 0.2 mg/dL — AB (ref 0.3–1.2)
BUN: 10 mg/dL (ref 6–23)
CO2: 31 mmol/L (ref 19–32)
CREATININE: 0.91 mg/dL (ref 0.50–1.10)
Calcium: 9.4 mg/dL (ref 8.4–10.5)
Chloride: 101 mmol/L (ref 96–112)
GFR calc Af Amer: 81 mL/min — ABNORMAL LOW (ref >90)
GFR calc non Af Amer: 70 mL/min — ABNORMAL LOW (ref >90)
Glucose, Bld: 98 mg/dL (ref 70–99)
Potassium: 3.7 mmol/L (ref 3.5–5.1)
Sodium: 140 mmol/L (ref 135–145)
TOTAL PROTEIN: 7.6 g/dL (ref 6.0–8.3)

## 2015-01-06 LAB — CBC WITH DIFFERENTIAL/PLATELET
BASOS PCT: 0 % (ref 0–1)
Basophils Absolute: 0 10*3/uL (ref 0.0–0.1)
EOS ABS: 0.1 10*3/uL (ref 0.0–0.7)
Eosinophils Relative: 1 % (ref 0–5)
HCT: 37.4 % (ref 36.0–46.0)
Hemoglobin: 12.5 g/dL (ref 12.0–15.0)
Lymphocytes Relative: 38 % (ref 12–46)
Lymphs Abs: 4.1 10*3/uL — ABNORMAL HIGH (ref 0.7–4.0)
MCH: 31.5 pg (ref 26.0–34.0)
MCHC: 33.4 g/dL (ref 30.0–36.0)
MCV: 94.2 fL (ref 78.0–100.0)
MONO ABS: 0.8 10*3/uL (ref 0.1–1.0)
Monocytes Relative: 7 % (ref 3–12)
NEUTROS ABS: 6 10*3/uL (ref 1.7–7.7)
Neutrophils Relative %: 54 % (ref 43–77)
Platelets: 313 10*3/uL (ref 150–400)
RBC: 3.97 MIL/uL (ref 3.87–5.11)
RDW: 13.5 % (ref 11.5–15.5)
WBC: 11 10*3/uL — ABNORMAL HIGH (ref 4.0–10.5)

## 2015-01-06 LAB — URINALYSIS, ROUTINE W REFLEX MICROSCOPIC
Bilirubin Urine: NEGATIVE
Glucose, UA: NEGATIVE mg/dL
Hgb urine dipstick: NEGATIVE
Ketones, ur: NEGATIVE mg/dL
Leukocytes, UA: NEGATIVE
Nitrite: NEGATIVE
PH: 6 (ref 5.0–8.0)
Protein, ur: NEGATIVE mg/dL
Specific Gravity, Urine: 1.01 (ref 1.005–1.030)
Urobilinogen, UA: 1 mg/dL (ref 0.0–1.0)

## 2015-01-06 LAB — I-STAT TROPONIN, ED: Troponin i, poc: 0 ng/mL (ref 0.00–0.08)

## 2015-01-06 LAB — PREGNANCY, URINE: PREG TEST UR: NEGATIVE

## 2015-01-06 LAB — LIPASE, BLOOD: Lipase: 30 U/L (ref 11–59)

## 2015-01-06 MED ORDER — ONDANSETRON HCL 4 MG PO TABS: 4.0000 mg | ORAL_TABLET | Freq: Three times a day (TID) | ORAL | Status: DC | PRN

## 2015-01-06 MED ORDER — SODIUM CHLORIDE 0.9 % IV BOLUS (SEPSIS)
1000.0000 mL | Freq: Once | INTRAVENOUS | Status: AC
  Administered 2015-01-06: 1000 mL via INTRAVENOUS

## 2015-01-06 MED ORDER — ONDANSETRON HCL 4 MG/2ML IJ SOLN
4.0000 mg | Freq: Once | INTRAMUSCULAR | Status: AC
  Administered 2015-01-06: 4 mg via INTRAVENOUS
  Filled 2015-01-06: qty 2

## 2015-01-06 NOTE — Discharge Instructions (Signed)
Alcohol Use Disorder °Alcohol use disorder is a mental disorder. It is not a one-time incident of heavy drinking. Alcohol use disorder is the excessive and uncontrollable use of alcohol over time that leads to problems with functioning in one or more areas of daily living. People with this disorder risk harming themselves and others when they drink to excess. Alcohol use disorder also can cause other mental disorders, such as mood and anxiety disorders, and serious physical problems. People with alcohol use disorder often misuse other drugs.  °Alcohol use disorder is common and widespread. Some people with this disorder drink alcohol to cope with or escape from negative life events. Others drink to relieve chronic pain or symptoms of mental illness. People with a family history of alcohol use disorder are at higher risk of losing control and using alcohol to excess.  °SYMPTOMS  °Signs and symptoms of alcohol use disorder may include the following:  °· Consumption of alcohol in larger amounts or over a longer period of time than intended. °· Multiple unsuccessful attempts to cut down or control alcohol use.   °· A great deal of time spent obtaining alcohol, using alcohol, or recovering from the effects of alcohol (hangover). °· A strong desire or urge to use alcohol (cravings).   °· Continued use of alcohol despite problems at work, school, or home because of alcohol use.   °· Continued use of alcohol despite problems in relationships because of alcohol use. °· Continued use of alcohol in situations when it is physically hazardous, such as driving a car. °· Continued use of alcohol despite awareness of a physical or psychological problem that is likely related to alcohol use. Physical problems related to alcohol use can involve the brain, heart, liver, stomach, and intestines. Psychological problems related to alcohol use include intoxication, depression, anxiety, psychosis, delirium, and dementia.   °· The need for  increased amounts of alcohol to achieve the same desired effect, or a decreased effect from the consumption of the same amount of alcohol (tolerance). °· Withdrawal symptoms upon reducing or stopping alcohol use, or alcohol use to reduce or avoid withdrawal symptoms. Withdrawal symptoms include: °¨ Racing heart. °¨ Hand tremor. °¨ Difficulty sleeping. °¨ Nausea. °¨ Vomiting. °¨ Hallucinations. °¨ Restlessness. °¨ Seizures. °DIAGNOSIS °Alcohol use disorder is diagnosed through an assessment by your health care provider. Your health care provider may start by asking three or four questions to screen for excessive or problematic alcohol use. To confirm a diagnosis of alcohol use disorder, at least two symptoms must be present within a 12-month period. The severity of alcohol use disorder depends on the number of symptoms: °· Mild--two or three. °· Moderate--four or five. °· Severe--six or more. °Your health care provider may perform a physical exam or use results from lab tests to see if you have physical problems resulting from alcohol use. Your health care provider may refer you to a mental health professional for evaluation. °TREATMENT  °Some people with alcohol use disorder are able to reduce their alcohol use to low-risk levels. Some people with alcohol use disorder need to quit drinking alcohol. When necessary, mental health professionals with specialized training in substance use treatment can help. Your health care provider can help you decide how severe your alcohol use disorder is and what type of treatment you need. The following forms of treatment are available:  °· Detoxification. Detoxification involves the use of prescription medicines to prevent alcohol withdrawal symptoms in the first week after quitting. This is important for people with a history of symptoms   of withdrawal and for heavy drinkers who are likely to have withdrawal symptoms. Alcohol withdrawal can be dangerous and, in severe cases, cause  death. Detoxification is usually provided in a hospital or in-patient substance use treatment facility. °· Counseling or talk therapy. Talk therapy is provided by substance use treatment counselors. It addresses the reasons people use alcohol and ways to keep them from drinking again. The goals of talk therapy are to help people with alcohol use disorder find healthy activities and ways to cope with life stress, to identify and avoid triggers for alcohol use, and to handle cravings, which can cause relapse. °· Medicines. Different medicines can help treat alcohol use disorder through the following actions: °¨ Decrease alcohol cravings. °¨ Decrease the positive reward response felt from alcohol use. °¨ Produce an uncomfortable physical reaction when alcohol is used (aversion therapy). °· Support groups. Support groups are run by people who have quit drinking. They provide emotional support, advice, and guidance. °These forms of treatment are often combined. Some people with alcohol use disorder benefit from intensive combination treatment provided by specialized substance use treatment centers. Both inpatient and outpatient treatment programs are available. °Document Released: 11/04/2004 Document Revised: 02/11/2014 Document Reviewed: 01/04/2013 °ExitCare® Patient Information ©2015 [employer]. This information is not intended to replace advice given to you by your health care provider. Make sure you discuss any questions you have with your health care provider. ° ° ° ° °Emergency Department Resource Guide °1) Find a Doctor and Pay Out of Pocket °Although you won't have to find out who is covered by your insurance plan, it is a good idea to ask around and get recommendations. You will then need to call the office and see if the doctor you have chosen will accept you as a new patient and what types of options they offer for patients who are self-pay. Some doctors offer discounts or will set up payment plans for  their patients who do not have insurance, but you will need to ask so you aren't surprised when you get to your appointment. ° °2) Contact Your Local Health Department °Not all health departments have doctors that can see patients for sick visits, but many do, so it is worth a call to see if yours does. If you don't know where your local health department is, you can check in your phone book. The CDC also has a tool to help you locate your state's health department, and many state websites also have listings of all of their local health departments. ° °3) Find a Walk-in Clinic °If your illness is not likely to be very severe or complicated, you may want to try a walk in clinic. These are popping up all over the country in pharmacies, drugstores, and shopping centers. They're usually staffed by nurse practitioners or physician assistants that have been trained to treat common illnesses and complaints. They're usually fairly quick and inexpensive. However, if you have serious medical issues or chronic medical problems, these are probably not your best option. ° °No Primary Care Doctor: °- Call [facility] at  832-8000 - they can help you locate a primary care doctor that  accepts your insurance, provides certain services, etc. °- Physician Referral Service- 1-800-533-3463 ° °Chronic Pain Problems: °Organization         Address  Phone   Notes  °[facility]  (336) 297-2271 Patients need to be referred by their primary care doctor.  ° °Medication Assistance: °Organization           Address  Phone   Notes  °[place] [facility] [address] °[place], [place] [address] (336) 641-8030 --Must be a resident of [place] °-- Must have NO insurance coverage whatsoever (no Medicaid/ Medicare, etc.) °-- The pt. MUST have a primary care doctor that directs their care regularly and follows them in the community °  °[facility]  (866) 331-1348   °[employer]  (888)  892-1162   ° °Agencies that provide inexpensive medical care: °Organization         Address  Phone   Notes  °[facility]  (336) 832-8035   °[facility]    (336) 832-7272   °[facility] [address] °[place], [place] [address] (336) 832-4777   °[facility] [address], °[place] (336) 271-4999   °[facility]    (336) 373-0678   °[facility]    (336) 272-1050   °[facility] ° [address], [place] Phone:  (336) 832-4444, Fax:  (336) 832-4440 Hours of Operation:  9 am - 6 pm, M-F.  Also accepts Medicaid/Medicare and self-pay.  °[facility] ° [address], [place] Phone: (336) 832-3150, Fax: (336) 832-3151. Hours of Operation:  8:30 am - 5:30 pm, M-F.  Also accepts Medicaid and self-pay.  °[facility] [place] [address], [place] Phone: (336) 878-6027   °[facility] [address], [place], [place] (336)723-1848, Ext. 123 Mondays & Thursdays: 7-9 AM.  First 15 patients are seen on a first come, first serve basis. °  ° °Medicaid-accepting [place] Providers: ° °Organization         Address  Phone   Notes  °[facility] [address], [place] (336) 641-2100 Also accepts self-pay patients.  °[facility] [address], [place] ° (336) 856-9996   °[facility] [address], [place] (336) 288-8857   °[facility] [address] [place] [address], [place] (336) 299-7000   °Veita Bland [address], [place]  ° (336) 373-1557 Only accepts [place] Access Medicaid patients after they have their name applied to their card.  ° °Self-Pay (no insurance) in [place]: ° °Organization         Address  Phone   Notes  °Sickle Cell Patients, [facility] [address], [place] (336)  832-1970   °[facility] [address], [place] (336) 832-4400   °[facility] ° [address], [place] (336) 992-4800   °[facility]/Dr. Osei-Bonsu ° [address] [place] [address], [place] or [address], [place] (336) 841-8500 Phone number for both [place] and [place] locations is the same.  °[facility] [address], [place] (336) 299-0000   °[facility] [address] [place] [address], [place] or [address] (336) 852-7530 °(336) 878-2260   °[facility] [address],  (336) 350-1642, phone; (336) 294-5005, fax Sees patients 1st and 3rd Saturday of every month.  Must not qualify for public or private insurance (i.e. Medicaid, Medicare, [place] Health Choice, Veterans' Benefits) • Household income should be no more than 200% of the poverty level •The clinic cannot treat you if you are pregnant or think you   are pregnant • Sexually transmitted diseases are not treated at the clinic.  ° ° °Dental Care: °Organization         Address  Phone  Notes  °[facility] [address], [place] (336) 641-6152 Accepts children up to age 21 who are enrolled in Medicaid or [place] Health Choice; pregnant women with a Medicaid card; and children who have applied for Medicaid or [place] Health Choice, but were declined, whose parents can pay a reduced fee at time of service.  °[facility] [place]  [address], [place] (336) 641-7733 Accepts children up to age 21 who are enrolled in Medicaid or [place] Health Choice; pregnant women with a Medicaid card; and children who have applied for Medicaid or [place] Health Choice, but were declined, whose parents can pay a reduced fee at time of service.  °[facility] ° [address], [place] (336) 641-4533 Patients are  seen by appointment only. Walk-ins are not accepted. [facility] will see patients 18 years of age and older. °Monday - Tuesday (8am-5pm) °Most Wednesdays (8:30-5pm) °$30 per visit, cash only  °[facility] ° [address], [place] (336) 641-4533 Patients are seen by appointment only. Walk-ins are not accepted. [facility] will see patients 18 years of age and older. °One Wednesday Evening (Monthly: Volunteer Based).  $30 per visit, cash only  °[facility]  (919) 537-3737 for adults; Children under age 4, call Graduate Pediatric Dentistry at (919) [phone]. Children aged 4-14, please call (919) [phone] to request a pediatric application. ° Dental services are provided in all areas of dental care including fillings, crowns and bridges, complete and partial dentures, implants, gum treatment, root canals, and extractions. Preventive care is also provided. Treatment is provided to both adults and children. °Patients are selected via a lottery and there is often a waiting list. °  °[facility] [address], °[place] ° (336) 763-8833 [email or web address] °  °[facility] [address], [place], [place] (336)723-1848, Ext. 123 Second and Fourth Thursday of each month, opens at 6:30 AM; Clinic ends at 9 AM.  Patients are seen on a first-come first-served basis, and a limited number are seen during each clinic.  ° °[facility] ° [address], [place], [place] (336) 723-7904   Eligibility Requirements °You must have lived in [place], or [place] counties for at least the last three months. °  You cannot be eligible for state or federal sponsored healthcare insurance, including Veterans Administration, Medicaid, or Medicare. °  You generally cannot be eligible for healthcare insurance through your employer.  °  How to apply: °Eligibility screenings are held every Tuesday and Wednesday afternoon from 1:00 pm until 4:00  pm. You do not need an appointment for the interview!  °[facility] [address], [place], [place] 336-631-2330   °[facility]  336-342-8273   °[facility]  336-703-3100   °[facility]  336-570-6415   ° °Behavioral Health Resources in the Community: °Intensive Outpatient Programs °Organization         Address  Phone  Notes  °[facility] [address], [place], [place] 336-878-6098   °[facility] [address], , [place] 336-832-9800   °[facility] [address], [place] [employer], [place] ° 336-882-2125   °[facility] [address],  °[place], [place] 1-800-853-5163 or 336-641-4981   °Substance Abuse Resources °Organization         Address  Phone  Notes  °[facility]  336-882-2125   °[facility]  336-784-9470   °[facility]  336-285-9073   °[facility]  336-845-3988   °Residential & Outpatient Substance Abuse Program  1-800-659-3381   °Psychological Services °Organization         Address  Phone  Notes  °[facility]  336- 832-9600   °[facility]  336- 378-7881   °[facility] [address], [place] 1-800-853-5163 or 336-641-4981   ° °Mobile Crisis Teams °Organization         Address  Phone  Notes  °[facility], Mobile Crisis Care Unit  1-877-626-1772   °Assertive °[facility] ° [address] [place], [place] 336-834-9664   °Sharon DeEsch [address] °[place] 336-554-5454   ° °Self-Help/Support Groups °Organization         Address  Phone             Notes  °[facility] - variety of support groups  336- 373-1402 Call for more information  °Narcotics Anonymous (NA), [facility] [address], °[place]  2 meetings at this location  ° °Residential Treatment Programs °Organization          Address  Phone  Notes  °[facility] [address],    °[place]  1-866-801-8205   °[facility] ° [address], [place], [place] 704-293-8524   °[facility] [address], [place] 336-845-3988 Admissions: 8am-3pm M-F  °[facility] [address]    °[place], [place] 336-841-1104   °[facility] [address], [place], [place] 336-379-7146   °[facility] [address]  °[place], [place] 336-285-9073   °[facility] - Intensive Outpatient [address], [place], [place] 336-852-3033   °[facility] [address]  °[place], [place] 1-877-615-2722 or 336-784-9470   °[facility] [address] [place], [place] 336-227-7417 Accepts Medicaid  °[facility] [address]  °[place] 1-800-659-3381 Substance Abuse/Addiction Treatment  ° °[place] Behavioral Health Resources °Organization         Address  Phone  Notes  °[facility]  (888) 581-9988   °Julie Brannon, PhD [address] [place], [place]   (336) 349-5553 or (336) 951-0000   °[facility]   [address] °[place], [place] (336) 349-4454   °[facility] [address], [place], [place] (336) 342-8316 Insurance/Medicaid/sponsorship through [facility]  °[facility] [address]                                    [place], [place] (336) 342-8316 Therapy/tele-psych/case  °[facility] [address]  ° [place],  (336) 349-2233    °Dr. Arfeen  (336) 349-4544   °[facility] [place]  [employer] [place] [facility] 1) [address],  °2) [address], [place] °3)  [address], [place] (336) 349-3220 °(336) 342-7768 ° °(336) 342-8140   °[place] [facility] (336)   342-1394 or (336) 342-3537 (After Hours)    ° ° ° °

## 2015-01-06 NOTE — ED Provider Notes (Addendum)
CSN: [id number]     Arrival date & time 01/06/15  1543 History   First MD Initiated Contact with Patient 01/06/15 1651     Chief Complaint  Patient presents with  . Alcohol Intoxication     (Consider location/radiation/quality/duration/timing/severity/associated sxs/prior Treatment) HPI [name] is a 54 year old female with past medical history of schizophrenia, schizoaffective disorder, cocaine abuse, alcohol abuse who presents the ER today complaining of nausea and vomiting. Patient states she was discharged from the hospital yesterday, and last evening used crack. She reports using alcohol since she left the hospital as well. She states drinking "several beers"throughout the day today. Patient denies chest pain, shortness of breath, dizziness, weakness, headache, blurred vision, abdominal pain, diarrhea, dysuria.  Past Medical History  Diagnosis Date  . Schizophrenia   . Schizoaffective disorder, bipolar type   . Cocaine abuse   . Alcohol abuse    Past Surgical History  Procedure Laterality Date  . Abdominal surgery     No family history on file. History  Substance Use Topics  . Smoking status: Current Every Day Smoker -- 0.00 packs/day    Types: Cigarettes  . Smokeless tobacco: Not on file  . Alcohol Use: Yes     Comment: patient drinks beer and wine daily   OB History    No data available     Review of Systems  Constitutional: Negative for fever.  HENT: Negative for trouble swallowing.   Eyes: Negative for visual disturbance.  Respiratory: Negative for shortness of breath.   Cardiovascular: Negative for chest pain.  Gastrointestinal: Positive for nausea and vomiting. Negative for abdominal pain.  Genitourinary: Negative for dysuria.  Musculoskeletal: Negative for neck pain.  Skin: Negative for rash.  Neurological: Negative for dizziness, weakness and numbness.  Psychiatric/Behavioral: Negative.       Allergies  Review of patient's allergies indicates no  known allergies.  Home Medications   Prior to Admission medications   Medication Sig Start Date End Date Taking? Authorizing Provider  divalproex (DEPAKOTE) 500 MG DR tablet Take 500 mg by mouth at bedtime.   Yes Historical Provider, MD  ondansetron (ZOFRAN) 4 MG tablet Take 1 tablet (4 mg total) by mouth every 8 (eight) hours as needed for nausea or vomiting. 01/06/15   Ladona MowJoe Alizee [place], PA-C  traZODone (DESYREL) 100 MG tablet Take 100 mg by mouth at bedtime.    Historical Provider, MD   BP 115/46 mmHg  Pulse 57  Temp(Src) 98.6 F (37 C) (Oral)  Resp 14  SpO2 97%  LMP 09/07/2012 Physical Exam  Constitutional: She is oriented to person, place, and time. She appears well-developed and well-nourished. No distress.  HENT:  Head: Normocephalic and atraumatic.  Mouth/Throat: Oropharynx is clear and moist. No oropharyngeal exudate.  Eyes: EOM are normal. Pupils are equal, round, and reactive to light. Right eye exhibits no discharge. Left eye exhibits no discharge. No scleral icterus.  Neck: Normal range of motion.  Cardiovascular: Normal rate, regular rhythm and normal heart sounds.   No murmur heard. Pulmonary/Chest: Effort normal and breath sounds normal. No respiratory distress.  Abdominal: Soft. There is no tenderness.  Musculoskeletal: Normal range of motion. She exhibits no edema or tenderness.  Neurological: She is alert and oriented to person, place, and time. No cranial nerve deficit or sensory deficit. She displays a negative Romberg sign. Coordination and gait normal. GCS eye subscore is 4. GCS verbal subscore is 5. GCS motor subscore is 6.  Patient fully alert, answering questions appropriately in full,  clear sentences. Cranial nerves II through XII grossly intact. Motor strength 5 out of 5 in all major muscle groups of upper and lower service. Distal sensation intact.  Skin: Skin is warm and dry. No rash noted. She is not diaphoretic.  Psychiatric: She has a normal mood and affect.   Nursing note and vitals reviewed.   ED Course  Procedures (including critical care time) Labs Review Labs Reviewed  CBC WITH DIFFERENTIAL/PLATELET - Abnormal; Notable for the following:    WBC 11.0 (*)    Lymphs Abs 4.1 (*)    All other components within normal limits  COMPREHENSIVE METABOLIC PANEL - Abnormal; Notable for the following:    Total Bilirubin 0.2 (*)    GFR calc non Af Amer 70 (*)    GFR calc Af Amer 81 (*)    All other components within normal limits  LIPASE, BLOOD  URINALYSIS, ROUTINE W REFLEX MICROSCOPIC  PREGNANCY, URINE  I-STAT TROPOININ, ED    Imaging Review No results found.   EKG Interpretation   Date/Time:  Monday January 06 2015 21:50:44 EDT Ventricular Rate:  67 PR Interval:  227 QRS Duration: 89 QT Interval:  392 QTC Calculation: 414 R Axis:   67 Text Interpretation:  Sinus rhythm Sinus rhythm with first degree AV block  Consider left ventricular hypertrophy Nonspecific T abnormalities, diffuse  leads No significant change since last tracing thought TW changes are new  from feb 2000 Confirmed by DOCHERTY  MD, MEGAN [phone]) on 01/06/2015 9:55:52  PM      MDM   Final diagnoses:  Alcohol use    Patient here with nausea and vomiting after she states she was drinking alcohol. On exam patient is afebrile, non-tachycardic, nontachypneic, non-hypoxic, well-appearing and in no acute distress. Labs unremarkable for acute pathology. After symptomatic therapy, patient states her symptoms have resolved. No abdominal pain, no concern for acute abdomen. No imaging needed at this time. Likely patient is experiencing side effects of alcohol. Patient sleeping in the ER. Patient tolerating by mouth well, able to ambulate throughout the ED. Patient hemodynamic stable throughout ER stay, we'll discharge patient at this time. Discharged patient with symptomatic therapy, and strongly encouraged follow-up with her primary care provider. Discussed return precautions  with patient, patient verbalizes understanding and agreement of this plan. I encouraged patient to call or to the ER if any worsening of symptoms or should she have any questions or concerns.  BP 115/46 mmHg  Pulse 57  Temp(Src) 98.6 F (37 C) (Oral)  Resp 14  SpO2 97%  LMP 09/07/2012  Signed,  Ladona Mow, PA-C 1:41 AM  Patient discussed with Dr. Toy Cookey, MD  Ladona Mow, PA-C 01/07/15 9604  Toy Cookey, MD 01/07/15 0153  Ladona Mow, PA-C 01/16/15 1711  Toy Cookey, MD 01/18/15 5409

## 2015-01-06 NOTE — ED Notes (Signed)
Pt in a hall bed therefore EKG is delayed, RN notified.

## 2015-01-06 NOTE — ED Notes (Signed)
Per EMS: pt c/o EToH and being sick all over from doing crack on Friday and a lot ETOH today.

## 2015-01-06 NOTE — ED Notes (Signed)
Pt given ginger ale to drink, pt states she didn't want to be bothered by no body, pt informed we need her to do a PO challenge so she can get discharged, pt sipping on ginger ale. Denied wanting anything to eat.

## 2015-01-06 NOTE — ED Notes (Signed)
Pt drank cup of ginger ale, tolerated well, now sleeping
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
## 2015-12-07 ENCOUNTER — Emergency Department (HOSPITAL_COMMUNITY)
Admission: EM | Admit: 2015-12-07 | Discharge: 2015-12-08 | Disposition: A | Discharge status: Home / Self Care | Payer: Medicaid Other | Attending: Emergency Medicine | Admitting: Emergency Medicine

## 2015-12-07 ENCOUNTER — Encounter (HOSPITAL_COMMUNITY): Payer: Self-pay | Admitting: Emergency Medicine

## 2015-12-07 DIAGNOSIS — Z79899 Other long term (current) drug therapy: Secondary | ICD-10-CM | POA: Insufficient documentation

## 2015-12-07 DIAGNOSIS — F17210 Nicotine dependence, cigarettes, uncomplicated: Secondary | ICD-10-CM | POA: Insufficient documentation

## 2015-12-07 DIAGNOSIS — F1494 Cocaine use, unspecified with cocaine-induced mood disorder: Secondary | ICD-10-CM | POA: Diagnosis not present

## 2015-12-07 DIAGNOSIS — F1414 Cocaine abuse with cocaine-induced mood disorder: Secondary | ICD-10-CM | POA: Diagnosis present

## 2015-12-07 DIAGNOSIS — F209 Schizophrenia, unspecified: Secondary | ICD-10-CM | POA: Insufficient documentation

## 2015-12-07 DIAGNOSIS — F22 Delusional disorders: Secondary | ICD-10-CM | POA: Diagnosis not present

## 2015-12-07 DIAGNOSIS — Z046 Encounter for general psychiatric examination, requested by authority: Secondary | ICD-10-CM | POA: Diagnosis present

## 2015-12-07 DIAGNOSIS — F1410 Cocaine abuse, uncomplicated: Secondary | ICD-10-CM | POA: Diagnosis present

## 2015-12-07 LAB — CBC WITH DIFFERENTIAL/PLATELET
BASOS ABS: 0 10*3/uL (ref 0.0–0.1)
BASOS PCT: 0 %
EOS ABS: 0.1 10*3/uL (ref 0.0–0.7)
Eosinophils Relative: 1 %
HCT: 36.6 % (ref 36.0–46.0)
Hemoglobin: 11.9 g/dL — ABNORMAL LOW (ref 12.0–15.0)
Lymphocytes Relative: 30 %
Lymphs Abs: 3.2 10*3/uL (ref 0.7–4.0)
MCH: 30.6 pg (ref 26.0–34.0)
MCHC: 32.5 g/dL (ref 30.0–36.0)
MCV: 94.1 fL (ref 78.0–100.0)
Monocytes Absolute: 0.8 10*3/uL (ref 0.1–1.0)
Monocytes Relative: 8 %
Neutro Abs: 6.4 10*3/uL (ref 1.7–7.7)
Neutrophils Relative %: 61 %
PLATELETS: 358 10*3/uL (ref 150–400)
RBC: 3.89 MIL/uL (ref 3.87–5.11)
RDW: 14.2 % (ref 11.5–15.5)
WBC: 10.5 10*3/uL (ref 4.0–10.5)

## 2015-12-07 LAB — COMPREHENSIVE METABOLIC PANEL
ALK PHOS: 58 U/L (ref 38–126)
ALT: 14 U/L (ref 14–54)
ANION GAP: 9 (ref 5–15)
AST: 18 U/L (ref 15–41)
Albumin: 4 g/dL (ref 3.5–5.0)
BILIRUBIN TOTAL: 0.4 mg/dL (ref 0.3–1.2)
BUN: 12 mg/dL (ref 6–20)
CALCIUM: 9.3 mg/dL (ref 8.9–10.3)
CO2: 26 mmol/L (ref 22–32)
Chloride: 106 mmol/L (ref 101–111)
Creatinine, Ser: 0.66 mg/dL (ref 0.44–1.00)
Glucose, Bld: 96 mg/dL (ref 65–99)
POTASSIUM: 3.7 mmol/L (ref 3.5–5.1)
Sodium: 141 mmol/L (ref 135–145)
TOTAL PROTEIN: 7.5 g/dL (ref 6.5–8.1)

## 2015-12-07 LAB — ETHANOL

## 2015-12-07 MED ORDER — ONDANSETRON HCL 4 MG PO TABS: 4.0000 mg | ORAL_TABLET | Freq: Three times a day (TID) | ORAL | Status: DC | PRN

## 2015-12-07 MED ORDER — ZOLPIDEM TARTRATE 5 MG PO TABS: 5.0000 mg | ORAL_TABLET | Freq: Every evening | ORAL | Status: DC | PRN

## 2015-12-07 MED ORDER — ALUM & MAG HYDROXIDE-SIMETH 200-200-20 MG/5ML PO SUSP: 30.0000 mL | ORAL | Status: DC | PRN

## 2015-12-07 MED ORDER — LORAZEPAM 1 MG PO TABS: 1.0000 mg | ORAL_TABLET | Freq: Three times a day (TID) | ORAL | Status: DC | PRN

## 2015-12-07 MED ORDER — NICOTINE 21 MG/24HR TD PT24: 21.0000 mg | MEDICATED_PATCH | Freq: Every day | TRANSDERMAL | Status: DC

## 2015-12-07 MED ORDER — IBUPROFEN 200 MG PO TABS: 600.0000 mg | ORAL_TABLET | Freq: Three times a day (TID) | ORAL | Status: DC | PRN

## 2015-12-07 MED ORDER — ACETAMINOPHEN 325 MG PO TABS: 650.0000 mg | ORAL_TABLET | ORAL | Status: DC | PRN

## 2015-12-07 NOTE — BH Assessment (Signed)
Assessment completed. Consulted with Nanine Means, DNP who recommends patient be observed overnight and evaluated by psychiatry in the morning.   Davina Poke, LCSW Therapeutic Triage Specialist [facility] 12/07/2015 5:48 PM

## 2015-12-07 NOTE — ED Provider Notes (Signed)
CSN: [id number]     Arrival date & time 12/07/15  1555 History   First MD Initiated Contact with Patient 12/07/15 1624     Chief Complaint  Patient presents with  . Medical Clearance     (Consider location/radiation/quality/duration/timing/severity/associated sxs/prior Treatment) HPI   Pt with hx schizophrenia, polysubstance abuse, prostitution, brought in by police after she called 911 requesting to be taken to mental health.  States she can't sleep or rest because people are standing over her all the time.  Admits to currently seeing people, hearing voices.  When asked about self harm and SI pt reports she wants to "run away" and maybe "run into the middle of the street."  She is concerned that everyone is trying to take her money and rob her and her sister keeps harassing her for money for other family members' car payments.  She also reports that her sister keeps sending "men up on me all the time" wanting her to do things to them but she won't.   Pt interjects with statements such as "don't let nobody hit me" and "I'm not Orland Jarred' my clothes off."  She also stands up as if to leave then sits back down without provocation or explanation.   Frequently stares off when asked a question and will not respond until a different question is asked. Per police officer, pt gets monthly haldol injections on the 15th of the month and had her for the month - pt has a business card of her nurse "Genevie Cheshire" who does the injections.    Past Medical History  Diagnosis Date  . Schizophrenia (HCC)   . Schizoaffective disorder, bipolar type (HCC)   . Cocaine abuse   . Alcohol abuse    Past Surgical History  Procedure Laterality Date  . Abdominal surgery     No family history on file. Social History  Substance Use Topics  . Smoking status: Current Every Day Smoker -- 0.00 packs/day    Types: Cigarettes  . Smokeless tobacco: None  . Alcohol Use: Yes     Comment: patient drinks beer and wine daily   OB  History    No data available     Review of Systems  Unable to perform ROS: Psychiatric disorder      Allergies  Review of patient's allergies indicates no known allergies.  Home Medications   Prior to Admission medications   Medication Sig Start Date End Date Taking? Authorizing Provider  divalproex (DEPAKOTE) 500 MG DR tablet Take 500 mg by mouth at bedtime.    Historical Provider, MD  ondansetron (ZOFRAN) 4 MG tablet Take 1 tablet (4 mg total) by mouth every 8 (eight) hours as needed for nausea or vomiting. 01/06/15   Ladona Mow, PA-C  traZODone (DESYREL) 100 MG tablet Take 100 mg by mouth at bedtime.    Historical Provider, MD   BP 149/64 mmHg  Pulse 63  Temp(Src) 98 F (36.7 C) (Oral)  Resp 18  SpO2 99%  LMP 09/07/2012 Physical Exam  Constitutional: She appears well-developed and well-nourished. No distress.  HENT:  Head: Normocephalic and atraumatic.  Neck: Neck supple.  Cardiovascular: Normal rate and regular rhythm.   Pulmonary/Chest: Effort normal and breath sounds normal. No respiratory distress. She has no wheezes. She has no rales.  Abdominal: Soft. She exhibits no distension. There is no tenderness. There is no rebound and no guarding.  Neurological: She is alert.  Skin: She is not diaphoretic.  Psychiatric: Her affect is labile. She is  withdrawn. Thought content is paranoid.  Nursing note and vitals reviewed.   ED Course  Procedures (including critical care time) Labs Review Labs Reviewed  CBC WITH DIFFERENTIAL/PLATELET - Abnormal; Notable for the following:    Hemoglobin 11.9 (*)    All other components within normal limits  COMPREHENSIVE METABOLIC PANEL  ETHANOL  URINE RAPID DRUG SCREEN, HOSP PERFORMED    Imaging Review No results found. I have personally reviewed and evaluated these images and lab results as part of my medical decision-making.   EKG Interpretation None       5:55 PM TTS has seen and evaluated patient. Pt to stay overnight  in SAPU.    MDM   Final diagnoses:  Schizophrenia, unspecified type (HCC)   Pt with hx schizophrenia called police for assistance.  She has auditory and visual hallucinations and seems to be paranoid, is not forthcoming with much information.  She does not have documented mental retardation but it is unclear how functional she typically is, she misinterpreted many things that I said and understood them as a reference to her - for example the officer told me the pt had her nurse's "business card" and patient became defensive about having a business card and that she wasn't a business woman.  Unclear if this is off her baseline.   Pt seen by TTS, plan for overnight stay in [facility].   Trixie Dredge, PA-C 12/07/15 2119  Pricilla Loveless, MD 12/09/15 [phone]

## 2015-12-07 NOTE — ED Notes (Signed)
Pt. To SAP[facility] from ED ambulatory without difficulty, to room 37 . Report from [facility]. Pt. Is alert and oriented, warm and dry in no distress. Pt. Denies SI, HI, and AVH. Pt. Loud but cooperative. Pt. Made aware of security cameras and Q15 minute rounds. Pt. Encouraged to let Nursing staff know of any concerns or needs.
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
## 2015-12-07 NOTE — ED Notes (Signed)
Patient and belongings have been wanded by security. Two patient belongings bags.

## 2015-12-07 NOTE — ED Notes (Signed)
Pt. Noted sleeping in room. No complaints or concerns voiced. No distress or abnormal behavior noted. Will continue to monitor with security cameras. Q 15 minute rounds continue. 
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
## 2015-12-08 DIAGNOSIS — F1410 Cocaine abuse, uncomplicated: Secondary | ICD-10-CM | POA: Diagnosis present

## 2015-12-08 DIAGNOSIS — F1494 Cocaine use, unspecified with cocaine-induced mood disorder: Secondary | ICD-10-CM | POA: Diagnosis not present

## 2015-12-08 DIAGNOSIS — F1414 Cocaine abuse with cocaine-induced mood disorder: Secondary | ICD-10-CM | POA: Diagnosis present

## 2015-12-08 NOTE — Discharge Instructions (Signed)
To help you maintain a sober lifestyle, a substance abuse treatment program may be beneficial to you.  Contact [facility] at your earliest opportunity to ask about enrolling in their program: ° °     [facility] °     [address], [address] °     [place], [place] [address] °     [phone] °     New patients are seen at the walk-in clinic every Tuesday from 9:00 am - 12:00 pm. °

## 2015-12-08 NOTE — ED Notes (Signed)
Pt. Noted sleeping in room. No complaints or concerns voiced. No distress or abnormal behavior noted. Will continue to monitor with security cameras. Q 15 minute rounds continue. 

## 2015-12-08 NOTE — Consult Note (Signed)
[place] Psychiatry Consult   Reason for Consult:  Cocaine abuse Referring Physician:  EDP Patient Identification: KANOELANI DOBIES MRN:  [id number] Principal Diagnosis: Cocaine-induced mood disorder [facility]) Diagnosis:   Patient Active Problem List   Diagnosis Date Noted  . Cocaine abuse [F14.10] 12/08/2015    Priority: High  . 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 Schizophrenia ([place]) [F20.9] 01/26/2014    Priority: High  . Polysubstance abuse [F19.10] 01/26/2014    Priority: High  . Substance induced mood disorder ([place]) [F19.94] 12/29/2014  . Hallucination [R44.3]   . Homicidal ideation [R45.850]   . Suicidal ideation [R45.851]     Total Time spent with patient: 45 minutes  Subjective:   [name] is a 55 y.o. female patient does not warrant admission.  HPI:  55 yo female who presented today with cocaine abuse and request to leave.  She was concerned about her sister "beating her up" over cocaine but no longer is concerned and wants to leave.  Denies suicidal/homicidal ideations, hallucinations, and withdrawal symptoms. Stable for discharge.  Past Psychiatric History: cocaine dependence, schizophrenia  Risk to Self: Suicidal Ideation: Yes-Currently Present Suicidal Intent: No Is patient at risk for suicide?: No Suicidal Plan?: No Access to Means: No What has been your use of drugs/alcohol within the last 12 months?: "Crack" How many times?:  ("several times") Other Self Harm Risks: Denies Triggers for Past Attempts: Unknown Intentional Self Injurious Behavior: None Risk to Others: Homicidal Ideation: No Thoughts of Harm to Others: No Current Homicidal Intent: No Current Homicidal Plan: No Access to Homicidal Means: No Identified Victim: Denies History of harm to others?: No Assessment of Violence: None Noted Violent Behavior Description: Denies Does patient have access to weapons?: No Criminal Charges Pending?:   Myer Haff) Does patient have a court date:  Myer Haff) Prior Inpatient Therapy: Prior Inpatient Therapy: Yes Prior Therapy Dates: 7/12,8/12, 2/16 Prior Therapy Facilty/Provider(s): [facility] Reason for Treatment: Psychosis Prior Outpatient Therapy: Prior Outpatient Therapy: Yes Prior Therapy Dates: [facility] Prior Therapy Facilty/Provider(s): [facility] Reason for Treatment: [facility] Does patient have an ACCT team?: Unknown Does patient have Intensive In-House Services?  : Unknown Does patient have [facility] services? : Unknown Does patient have P4CC services?: No  Past Medical History:  Past Medical History  Diagnosis Date  . Schizophrenia ([place])   . Schizoaffective disorder, bipolar type ([place])   . Cocaine abuse   . Alcohol abuse     Past Surgical History  Procedure Laterality Date  . Abdominal surgery     Family History: No family history on file. Family Psychiatric  History: Substance abuse Social History:  History  Alcohol Use  . Yes    Comment: patient drinks beer and wine daily     History  Drug Use  . Yes  . Special: "Crack" cocaine    Comment: crack use daily    Social History   Social History  . Marital Status: Single    Spouse Name: N/A  . Number of Children: N/A  . Years of Education: N/A   Social History Main Topics  . Smoking status: Current Every Day Smoker -- 0.00 packs/day    Types: Cigarettes  . Smokeless tobacco: None  . Alcohol Use: Yes     Comment: patient drinks beer and wine daily  . Drug Use: Yes    Special: "Crack" cocaine     Comment: crack use daily  . Sexual Activity: Not Asked   Other Topics Concern  .  None   Social History Narrative   Additional Social History:    Allergies:  No Known Allergies  Labs:  Results for orders placed or performed during the hospital encounter of 12/07/15 (from the past 48 hour(s))  Comprehensive metabolic panel     Status: None   Collection Time: 12/07/15  4:54 PM  Result Value Ref Range   Sodium 141 135 - 145 mmol/L    Potassium 3.7 3.5 - 5.1 mmol/L   Chloride 106 101 - 111 mmol/L   CO2 26 22 - 32 mmol/L   Glucose, Bld 96 65 - 99 mg/dL   BUN 12 6 - 20 mg/dL   Creatinine, Ser 0.66 0.44 - 1.00 mg/dL   Calcium 9.3 8.9 - 10.3 mg/dL   Total Protein 7.5 6.5 - 8.1 g/dL   Albumin 4.0 3.5 - 5.0 g/dL   AST 18 15 - 41 U/L   ALT 14 14 - 54 U/L   Alkaline Phosphatase 58 38 - 126 U/L   Total Bilirubin 0.4 0.3 - 1.2 mg/dL   GFR calc non Af Amer >60 >60 mL/min   GFR calc Af Amer >60 >60 mL/min    Comment: (NOTE) The eGFR has been calculated using the CKD EPI equation. This calculation has not been validated in all clinical situations. eGFR's persistently <60 mL/min signify possible Chronic Kidney Disease.    Anion gap 9 5 - 15  Ethanol     Status: None   Collection Time: 12/07/15  4:54 PM  Result Value Ref Range   Alcohol, Ethyl (B) <5 <5 mg/dL    Comment:        LOWEST DETECTABLE LIMIT FOR SERUM ALCOHOL IS 5 mg/dL FOR MEDICAL PURPOSES ONLY   CBC with Differential     Status: Abnormal   Collection Time: 12/07/15  4:54 PM  Result Value Ref Range   WBC 10.5 4.0 - 10.5 K/uL   RBC 3.89 3.87 - 5.11 MIL/uL   Hemoglobin 11.9 (L) 12.0 - 15.0 g/dL   HCT 36.6 36.0 - 46.0 %   MCV 94.1 78.0 - 100.0 fL   MCH 30.6 26.0 - 34.0 pg   MCHC 32.5 30.0 - 36.0 g/dL   RDW 14.2 11.5 - 15.5 %   Platelets 358 150 - 400 K/uL   Neutrophils Relative % 61 %   Neutro Abs 6.4 1.7 - 7.7 K/uL   Lymphocytes Relative 30 %   Lymphs Abs 3.2 0.7 - 4.0 K/uL   Monocytes Relative 8 %   Monocytes Absolute 0.8 0.1 - 1.0 K/uL   Eosinophils Relative 1 %   Eosinophils Absolute 0.1 0.0 - 0.7 K/uL   Basophils Relative 0 %   Basophils Absolute 0.0 0.0 - 0.1 K/uL    Current Facility-Administered Medications  Medication Dose Route Frequency Provider Last Rate Last Dose  . acetaminophen (TYLENOL) tablet 650 mg  650 mg Oral Q4H PRN Clayton Bibles, PA-C      . alum & mag hydroxide-simeth (MAALOX/MYLANTA) 200-200-20 MG/5ML suspension 30 mL  30 mL  Oral PRN Clayton Bibles, PA-C      . ibuprofen (ADVIL,MOTRIN) tablet 600 mg  600 mg Oral Q8H PRN Clayton Bibles, PA-C      . nicotine (NICODERM CQ - dosed in mg/24 hours) patch 21 mg  21 mg Transdermal Daily Emily West, PA-C      . ondansetron [facility]) tablet 4 mg  4 mg Oral Q8H PRN Clayton Bibles, PA-C      . zolpidem (AMBIEN) tablet 5  mg  5 mg Oral QHS PRN Clayton Bibles, PA-C       Current Outpatient Prescriptions  Medication Sig Dispense Refill  . divalproex (DEPAKOTE) 500 MG DR tablet Take 500 mg by mouth at bedtime.    . traZODone (DESYREL) 100 MG tablet Take 100 mg by mouth at bedtime.    . ondansetron (ZOFRAN) 4 MG tablet Take 1 tablet (4 mg total) by mouth every 8 (eight) hours as needed for nausea or vomiting. (Patient not taking: Reported on 12/07/2015) 12 tablet 0    Musculoskeletal: Strength & Muscle Tone: within normal limits Gait & Station: normal Patient leans: N/A  Psychiatric Specialty Exam: Review of Systems  Constitutional: Negative.   HENT: Negative.   Eyes: Negative.   Respiratory: Negative.   Cardiovascular: Negative.   Gastrointestinal: Negative.   Genitourinary: Negative.   Musculoskeletal: Negative.   Skin: Negative.   Neurological: Negative.   Endo/Heme/Allergies: Negative.   Psychiatric/Behavioral: Positive for substance abuse.    Blood pressure 123/69, pulse 69, temperature 97.5 F (36.4 C), temperature source Oral, resp. rate 16, last menstrual period 09/07/2012, SpO2 98 %.There is no weight on file to calculate BMI.  General Appearance: Casual  Eye Contact::  Good  Speech:  Normal Rate  Volume:  Normal  Mood:  Irritable  Affect:  Blunt  Thought Process:  Coherent  Orientation:  Full (Time, Place, and Person)  Thought Content:  WDL  Suicidal Thoughts:  No  Homicidal Thoughts:  No  Memory:  Immediate;   Good Recent;   Good Remote;   Good  Judgement:  Fair  Insight:  Fair  Psychomotor Activity:  Normal  Concentration:  Good  Recall:  Good  Fund of  Knowledge:Fair  Language: Fair  Akathisia:  No  Handed:  Right  AIMS (if indicated):     Assets:  Leisure Time Physical Health Resilience Social Support  ADL's:  Intact  Cognition: WNL  Sleep:      Treatment Plan Summary: Daily contact with patient to assess and evaluate symptoms and progress in treatment, Medication management and Plan cocaine induced mood disorder:  -Crisis stabilization -Medication management:  PRN medications in place. -Individual and substance abuse counseling  Disposition: No evidence of imminent risk to self or others at present.    Waylan Boga, NP 12/08/2015 12:17 PM

## 2015-12-08 NOTE — BHH Suicide Risk Assessment (Signed)
Suicide Risk Assessment  Discharge Assessment   [facility] Discharge Suicide Risk Assessment   Principal Problem: Cocaine-induced mood disorder [facility]) Discharge Diagnoses:  Patient Active Problem List   Diagnosis Date Noted  . Cocaine abuse [F14.10] 12/08/2015    Priority: High  . 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 Schizophrenia ([facility]) [F20.9] 01/26/2014    Priority: High  . Polysubstance abuse [F19.10] 01/26/2014    Priority: High  . Substance induced mood disorder ([facility]) [F19.94] 12/29/2014  . Hallucination [R44.3]   . Homicidal ideation [R45.850]   . Suicidal ideation [R45.851]     Total Time spent with patient: 45 minutes  Musculoskeletal: Strength & Muscle Tone: within normal limits Gait & Station: normal Patient leans: N/A  Psychiatric Specialty Exam: Review of Systems  Constitutional: Negative.   HENT: Negative.   Eyes: Negative.   Respiratory: Negative.   Cardiovascular: Negative.   Gastrointestinal: Negative.   Genitourinary: Negative.   Musculoskeletal: Negative.   Skin: Negative.   Neurological: Negative.   Endo/Heme/Allergies: Negative.   Psychiatric/Behavioral: Positive for substance abuse.    Blood pressure 123/69, pulse 69, temperature 97.5 F (36.4 C), temperature source Oral, resp. rate 16, last menstrual period 09/07/2012, SpO2 98 %.There is no weight on file to calculate BMI.  General Appearance: Casual  Eye Contact::  Good  Speech:  Normal Rate  Volume:  Normal  Mood:  Irritable  Affect:  Blunt  Thought Process:  Coherent  Orientation:  Full (Time, Place, and Person)  Thought Content:  WDL  Suicidal Thoughts:  No  Homicidal Thoughts:  No  Memory:  Immediate;   Good Recent;   Good Remote;   Good  Judgement:  Fair  Insight:  Fair  Psychomotor Activity:  Normal  Concentration:  Good  Recall:  Good  Fund of Knowledge:Fair  Language: Fair  Akathisia:  No  Handed:  Right  AIMS (if indicated):      Assets:  Leisure Time Physical Health Resilience Social Support  ADL's:  Intact  Cognition: WNL  Sleep:       Mental Status Per Nursing Assessment::   On Admission:   cocaine abuse  Demographic Factors:  NA  Loss Factors: NA  Historical Factors: Family history of mental illness or substance abuse  Risk Reduction Factors:   Sense of responsibility to family, Living with another person, especially a relative, Positive social support and Positive therapeutic relationship  Continued Clinical Symptoms:  Irritability  Cognitive Features That Contribute To Risk:  None    Suicide Risk:  Minimal: No identifiable suicidal ideation.  Patients presenting with no risk factors but with morbid ruminations; may be classified as minimal risk based on the severity of the depressive symptoms    Plan Of Care/Follow-up recommendations:  Activity:  as tolerated  Diet:  heart healthy diet  Danielle Reznick, NP 12/08/2015, 12:27 PM

## 2015-12-08 NOTE — BH Assessment (Signed)
BHH Assessment Progress Note  Per Carolanne Grumbling, MD, this patient does not require psychiatric hospitalization at this time.  Pt is to be discharged from [facility] with substance abuse treatment referral information.  Discharge instructions advise pt to follow up with [facility].  Pt's nurse, Kendal Hymen, has been notified.  Doylene Canning, MA Triage Specialist [phone]

## 2015-12-08 NOTE — Progress Notes (Signed)
Entered in d/c instructions   Medicine, [facility] Schedule an appointment as soon as possible for a visit As needed This is your assigned Medicaid [place] access doctor If you prefer another contact DSS [phone] DSS assigned your doctor *You may receive a bill if you go to any family Dr not assigned to you [address] [place] [address] [phone] [facility] access coverage [place]: [phone] [address] [place], [place] [address] [email or web address] Use this website to assist with understanding your coverage & to renew application As a Medicaid client you MUST contact DSS/SSI each time you change address, move to another [place] county or another state to keep your address updated  [name] Medicaid Transportation to Dr appts if you are have full Medicaid: [phone], [phone]

## 2015-12-08 NOTE — ED Notes (Signed)
Pt discharged ambulatory with ACT team.  Pt was in no distress.  All belongings were returned to patient at discharge.

## 2015-12-10 ENCOUNTER — Encounter (HOSPITAL_COMMUNITY): Payer: Self-pay | Admitting: Emergency Medicine

## 2015-12-10 ENCOUNTER — Emergency Department (HOSPITAL_COMMUNITY)
Admission: EM | Admit: 2015-12-10 | Discharge: 2015-12-10 | Disposition: A | Discharge status: Home / Self Care | Payer: Medicaid Other | Attending: Emergency Medicine | Admitting: Emergency Medicine

## 2015-12-10 DIAGNOSIS — Z9889 Other specified postprocedural states: Secondary | ICD-10-CM | POA: Insufficient documentation

## 2015-12-10 DIAGNOSIS — F17210 Nicotine dependence, cigarettes, uncomplicated: Secondary | ICD-10-CM | POA: Insufficient documentation

## 2015-12-10 DIAGNOSIS — F209 Schizophrenia, unspecified: Secondary | ICD-10-CM | POA: Diagnosis not present

## 2015-12-10 DIAGNOSIS — R197 Diarrhea, unspecified: Secondary | ICD-10-CM | POA: Diagnosis present

## 2015-12-10 LAB — SALICYLATE LEVEL

## 2015-12-10 LAB — COMPREHENSIVE METABOLIC PANEL
ALK PHOS: 58 U/L (ref 38–126)
ALT: 14 U/L (ref 14–54)
ANION GAP: 7 (ref 5–15)
AST: 17 U/L (ref 15–41)
Albumin: 4.4 g/dL (ref 3.5–5.0)
BILIRUBIN TOTAL: 0.6 mg/dL (ref 0.3–1.2)
BUN: 8 mg/dL (ref 6–20)
CALCIUM: 9.6 mg/dL (ref 8.9–10.3)
CO2: 30 mmol/L (ref 22–32)
Chloride: 105 mmol/L (ref 101–111)
Creatinine, Ser: 0.61 mg/dL (ref 0.44–1.00)
GFR calc Af Amer: >60 mL/min (ref >60)
Glucose, Bld: 94 mg/dL (ref 65–99)
POTASSIUM: 4.1 mmol/L (ref 3.5–5.1)
Sodium: 142 mmol/L (ref 135–145)
TOTAL PROTEIN: 8 g/dL (ref 6.5–8.1)

## 2015-12-10 LAB — CBC WITH DIFFERENTIAL/PLATELET
Basophils Absolute: 0 10*3/uL (ref 0.0–0.1)
Basophils Relative: 0 %
Eosinophils Absolute: 0.1 10*3/uL (ref 0.0–0.7)
Eosinophils Relative: 1 %
HCT: 35.7 % — ABNORMAL LOW (ref 36.0–46.0)
HEMOGLOBIN: 11.6 g/dL — AB (ref 12.0–15.0)
LYMPHS PCT: 29 %
Lymphs Abs: 2.4 10*3/uL (ref 0.7–4.0)
MCH: 31 pg (ref 26.0–34.0)
MCHC: 32.5 g/dL (ref 30.0–36.0)
MCV: 95.5 fL (ref 78.0–100.0)
MONO ABS: 0.6 10*3/uL (ref 0.1–1.0)
MONOS PCT: 8 %
Neutro Abs: 5.1 10*3/uL (ref 1.7–7.7)
Neutrophils Relative %: 62 %
Platelets: 335 10*3/uL (ref 150–400)
RBC: 3.74 MIL/uL — ABNORMAL LOW (ref 3.87–5.11)
RDW: 14.2 % (ref 11.5–15.5)
WBC: 8.2 10*3/uL (ref 4.0–10.5)

## 2015-12-10 LAB — ETHANOL: Alcohol, Ethyl (B): <5 mg/dL (ref <5)

## 2015-12-10 LAB — ACETAMINOPHEN LEVEL: Acetaminophen (Tylenol), Serum: <10 ug/mL — ABNORMAL LOW (ref 10–30)

## 2015-12-10 LAB — CBG MONITORING, ED: Glucose-Capillary: 98 mg/dL (ref 65–99)

## 2015-12-10 LAB — LIPASE, BLOOD: LIPASE: 44 U/L (ref 11–51)

## 2015-12-10 MED ORDER — SODIUM CHLORIDE 0.9 % IV BOLUS (SEPSIS): 1000.0000 mL | Freq: Once | INTRAVENOUS | Status: DC

## 2015-12-10 NOTE — ED Provider Notes (Signed)
CSN: [id number]     Arrival date & time 12/10/15  1242 History   First MD Initiated Contact with Patient 12/10/15 1346     Chief Complaint  Patient presents with  . Abdominal Pain     (Consider location/radiation/quality/duration/timing/severity/associated sxs/prior Treatment) HPI 55 year old female who presents with diarrhea. History of paranoid schizophrenia, polysubstance abuse, and history of substance abuse psychosis. Discharged yesterday from [facility] for cocaine induced psychosis. . I am unable to get history from her as she is refusing to talk to me. Denies having any pain with me, but refusing to answer further questions. After nurse offered her sandwich to talk to her, she told her nurse that she is having one day of diarrhea with nausea. No abdominal pain or vomiting.   Past Medical History  Diagnosis Date  . Schizophrenia ([facility])   . Schizoaffective disorder, bipolar type ([facility])   . Cocaine abuse   . Alcohol abuse    Past Surgical History  Procedure Laterality Date  . Abdominal surgery     History reviewed. No pertinent family history. Social History  Substance Use Topics  . Smoking status: Current Every Day Smoker -- 0.00 packs/day    Types: Cigarettes  . Smokeless tobacco: None  . Alcohol Use: Yes     Comment: patient drinks beer and wine daily   OB History    No data available     Review of Systems 10/14 systems reviewed and are negative other than those stated in the HPI   Allergies  Review of patient's allergies indicates no known allergies.  Home Medications   Prior to Admission medications   Medication Sig Start Date End Date Taking? Authorizing Provider  paliperidone (INVEGA SUSTENNA) 234 MG/1.5ML SUSP injection Inject 234 mg into the muscle every 30 (thirty) days.   Yes Historical Provider, MD   BP 133/53 mmHg  Pulse 60  Temp(Src) 97.5 F (36.4 C) (Oral)  Resp 18  SpO2 94%  LMP 09/07/2012 Physical Exam Physical Exam  Nursing note and vitals  reviewed. Constitutional: Well developed, well nourished, non-toxic, and in no acute distress, comfortably sleeping on bed but easily arouses to voice Head: Normocephalic and atraumatic.  Mouth/Throat: Oropharynx is clear and moist.  Neck: Normal range of motion. Neck supple.  Cardiovascular: Normal rate and regular rhythm.   Pulmonary/Chest: Effort normal and breath sounds normal.  Abdominal: Soft. There is no tenderness. There is no rebound and no guarding.  Musculoskeletal: Normal range of motion.  Neurological: Alert, no facial droop, fluent speech, moves all extremities symmetrically Skin: Skin is warm and dry.  Psychiatric: Cooperative  ED Course  Procedures (including critical care time) Labs Review Labs Reviewed  CBC WITH DIFFERENTIAL/PLATELET - Abnormal; Notable for the following:    RBC 3.74 (*)    Hemoglobin 11.6 (*)    HCT 35.7 (*)    All other components within normal limits  ACETAMINOPHEN LEVEL - Abnormal; Notable for the following:    Acetaminophen (Tylenol), Serum <10 (*)    All other components within normal limits  COMPREHENSIVE METABOLIC PANEL  LIPASE, BLOOD  SALICYLATE LEVEL  ETHANOL  URINALYSIS, ROUTINE W REFLEX MICROSCOPIC (NOT AT [facility])  URINE RAPID DRUG SCREEN, HOSP PERFORMED  CBG MONITORING, ED    Imaging Review No results found. I have personally reviewed and evaluated these images and lab results as part of my medical decision-making.   EKG Interpretation None      MDM   Final diagnoses:  Diarrhea, unspecified type  55 year old female with history of schizophrenia and substance induced psychosis who presents with diarrhea. She is difficult to elicit history and exam from due to poor cooperation with the exam and questioning. Does not seem to be at danger to herself or others, and does not seem to be responding to internal stimuli. With distraction has a benign abdomen. His overall unremarkable blood work including CBC, comprehensive  metabolic panel, lipase. Overall medically cleared. I spoke to behavioral health nurse practitioner,  Bonnetta Barry NP who states that this person is at her baseline mental status. She has had extensive evaluation from psychiatry before regarding similar behavior, was not deemed to be harmful to herself or others. She had just left behavioral health yesterday, and had suicide risk assessment and deemed minimal risk. No acute issues today. She will follow-up with outpatient psych. Appropriate for discharge home. Strict return and follow-up instructions reviewed. She expressed understanding of all discharge instructions and felt comfortable with the plan of care.   Lavera Guise, MD 12/10/15 [phone]

## 2015-12-10 NOTE — Discharge Instructions (Signed)
Return without fail for worsening symptoms, including fever, vomiting and unable to keep down food/fluids, worsening pain, or any other symptoms concerning to you.  Diarrhea Diarrhea is frequent loose and watery bowel movements. It can cause you to feel weak and dehydrated. Dehydration can cause you to become tired and thirsty, have a dry mouth, and have decreased urination that often is dark yellow. Diarrhea is a sign of another problem, most often an infection that will not last long. In most cases, diarrhea typically lasts 2-3 days. However, it can last longer if it is a sign of something more serious. It is important to treat your diarrhea as directed by your caregiver to lessen or prevent future episodes of diarrhea. CAUSES  Some common causes include:  Gastrointestinal infections caused by viruses, bacteria, or parasites.  Food poisoning or food allergies.  Certain medicines, such as antibiotics, chemotherapy, and laxatives.  Artificial sweeteners and fructose.  Digestive disorders. HOME CARE INSTRUCTIONS  Ensure adequate fluid intake (hydration): Have 1 cup (8 oz) of fluid for each diarrhea episode. Avoid fluids that contain simple sugars or sports drinks, fruit juices, whole milk products, and sodas. Your urine should be clear or pale yellow if you are drinking enough fluids. Hydrate with an oral rehydration solution that you can purchase at pharmacies, retail stores, and online. You can prepare an oral rehydration solution at home by mixing the following ingredients together:   - tsp table salt.   tsp baking soda.   tsp salt substitute containing potassium chloride.  1  tablespoons sugar.  1 L (34 oz) of water.  Certain foods and beverages may increase the speed at which food moves through the gastrointestinal (GI) tract. These foods and beverages should be avoided and include:  Caffeinated and alcoholic beverages.  High-fiber foods, such as raw fruits and vegetables,  nuts, seeds, and whole grain breads and cereals.  Foods and beverages sweetened with sugar alcohols, such as xylitol, sorbitol, and mannitol.  Some foods may be well tolerated and may help thicken stool including:  Starchy foods, such as rice, toast, pasta, low-sugar cereal, oatmeal, grits, baked potatoes, crackers, and bagels.  Bananas.  Applesauce.  Add probiotic-rich foods to help increase healthy bacteria in the GI tract, such as yogurt and fermented milk products.  Wash your hands well after each diarrhea episode.  Only take over-the-counter or prescription medicines as directed by your caregiver.  Take a warm bath to relieve any burning or pain from frequent diarrhea episodes. SEEK IMMEDIATE MEDICAL CARE IF:   You are unable to keep fluids down.  You have persistent vomiting.  You have blood in your stool, or your stools are black and tarry.  You do not urinate in 6-8 hours, or there is only a small amount of very dark urine.  You have abdominal pain that increases or localizes.  You have weakness, dizziness, confusion, or light-headedness.  You have a severe headache.  Your diarrhea gets worse or does not get better.  You have a fever or persistent symptoms for more than 2-3 days.  You have a fever and your symptoms suddenly get worse. MAKE SURE YOU:   Understand these instructions.  Will watch your condition.  Will get help right away if you are not doing well or get worse.   This information is not intended to replace advice given to you by your health care provider. Make sure you discuss any questions you have with your health care provider.   Document Released: 09/17/2002  Document Revised: 10/18/2014 Document Reviewed: 06/04/2012 Elsevier Interactive Patient Education [employer]

## 2015-12-10 NOTE — ED Notes (Signed)
Per [facility] pt was at [facility] gas station, reports gl abd pain , no nausea, no emesis. Pt sates," it feels like stomach ache". Pt in no obvious distress. Alert and oriented x 4.

## 2015-12-15 ENCOUNTER — Encounter (HOSPITAL_COMMUNITY): Payer: Self-pay | Admitting: Emergency Medicine

## 2015-12-15 ENCOUNTER — Emergency Department (HOSPITAL_COMMUNITY)
Admission: EM | Admit: 2015-12-15 | Discharge: 2015-12-16 | Payer: Medicaid Other | Attending: Emergency Medicine | Admitting: Emergency Medicine

## 2015-12-15 DIAGNOSIS — F1414 Cocaine abuse with cocaine-induced mood disorder: Secondary | ICD-10-CM | POA: Insufficient documentation

## 2015-12-15 DIAGNOSIS — F250 Schizoaffective disorder, bipolar type: Secondary | ICD-10-CM | POA: Diagnosis not present

## 2015-12-15 DIAGNOSIS — F17210 Nicotine dependence, cigarettes, uncomplicated: Secondary | ICD-10-CM | POA: Diagnosis not present

## 2015-12-15 DIAGNOSIS — F29 Unspecified psychosis not due to a substance or known physiological condition: Secondary | ICD-10-CM | POA: Insufficient documentation

## 2015-12-15 DIAGNOSIS — F22 Delusional disorders: Secondary | ICD-10-CM | POA: Insufficient documentation

## 2015-12-15 DIAGNOSIS — F329 Major depressive disorder, single episode, unspecified: Secondary | ICD-10-CM | POA: Diagnosis present

## 2015-12-15 DIAGNOSIS — Z79899 Other long term (current) drug therapy: Secondary | ICD-10-CM | POA: Diagnosis not present

## 2015-12-15 DIAGNOSIS — F1494 Cocaine use, unspecified with cocaine-induced mood disorder: Secondary | ICD-10-CM

## 2015-12-15 LAB — CBC
HCT: 36 % (ref 36.0–46.0)
HEMOGLOBIN: 11.6 g/dL — AB (ref 12.0–15.0)
MCH: 30.6 pg (ref 26.0–34.0)
MCHC: 32.2 g/dL (ref 30.0–36.0)
MCV: 95 fL (ref 78.0–100.0)
Platelets: 336 10*3/uL (ref 150–400)
RBC: 3.79 MIL/uL — AB (ref 3.87–5.11)
RDW: 14.2 % (ref 11.5–15.5)
WBC: 10.2 10*3/uL (ref 4.0–10.5)

## 2015-12-15 LAB — COMPREHENSIVE METABOLIC PANEL
ALBUMIN: 4.1 g/dL (ref 3.5–5.0)
ALK PHOS: 59 U/L (ref 38–126)
ALT: 13 U/L — ABNORMAL LOW (ref 14–54)
ANION GAP: 8 (ref 5–15)
AST: 13 U/L — ABNORMAL LOW (ref 15–41)
BUN: 15 mg/dL (ref 6–20)
CALCIUM: 9.3 mg/dL (ref 8.9–10.3)
CO2: 25 mmol/L (ref 22–32)
Chloride: 106 mmol/L (ref 101–111)
Creatinine, Ser: 0.67 mg/dL (ref 0.44–1.00)
GFR calc Af Amer: >60 mL/min (ref >60)
GLUCOSE: 129 mg/dL — AB (ref 65–99)
POTASSIUM: 3.7 mmol/L (ref 3.5–5.1)
Sodium: 139 mmol/L (ref 135–145)
TOTAL PROTEIN: 7.3 g/dL (ref 6.5–8.1)

## 2015-12-15 LAB — SALICYLATE LEVEL

## 2015-12-15 LAB — ETHANOL: Alcohol, Ethyl (B): <5 mg/dL (ref <5)

## 2015-12-15 LAB — ACETAMINOPHEN LEVEL: Acetaminophen (Tylenol), Serum: <10 ug/mL — ABNORMAL LOW (ref 10–30)

## 2015-12-15 MED ORDER — ONDANSETRON HCL 4 MG PO TABS: 4.0000 mg | ORAL_TABLET | Freq: Three times a day (TID) | ORAL | Status: DC | PRN

## 2015-12-15 MED ORDER — ALUM & MAG HYDROXIDE-SIMETH 200-200-20 MG/5ML PO SUSP: 30.0000 mL | ORAL | Status: DC | PRN

## 2015-12-15 MED ORDER — NICOTINE 21 MG/24HR TD PT24: 21.0000 mg | MEDICATED_PATCH | Freq: Every day | TRANSDERMAL | Status: DC

## 2015-12-15 NOTE — ED Notes (Signed)
Kelly with [facility] team request for call with any info at [phone]

## 2015-12-15 NOTE — BH Assessment (Addendum)
Tele Assessment Note   [name] is an 55 y.o. female who presents unaccompanied to [facility] ED after being dropped off by her ACTT team. Pt has a history of schizophrenia, alcohol abuse and cocaine use. Pt appears actively psychotic, paranoid and had difficulty answering questions appropriately. She believes people are trying to harm her or "hit on me", including people in the ED. She says people are following her. Pt says she can't walk and needs a wheelchair and then walks across the room to try to find a nurse. Pt's thoughts are disorganized. She denies current suicidal ideation but appears to not fully understand the question. He she denies thoughts of wanting to harm others, insisting that others are wanting to harm her. Pt does acknowledge that she uses crack and beer when she can and states her last use was yesterday.   Denny Peon, ACTT provider with [facility] at [phone], who said they were called today because Pt was at [facility] and appeared very paranoid and psychotic. Pt had her last Invega injection two weeks ago. Pt does have a place to live but refuses to stay there. Pt has a history of inpatient psychiatric treatment at various area facilities.  Pt is dressed in hospital scrubs and appears disheveled. She is alert but only oriented to person and place. She doesn't know why she is in the ED, she says the year is 2005 and doesn't know the president of the [place]. Her speech is loud and motor behavior is restless. Eye contact is fair. Pt's mood is suspicious, anxious and fearful and affect is congruent with mood. Thought process is disorganized and Pt has flight of ideas. Thought content is paranoid. She appears at times to be responding to internal stimuli.   Diagnosis: Schizophrenia; Cocaine Use Disorder, Moderate; Alcohol Use Disorder, Moderate.  Past Medical History:  Past Medical History  Diagnosis Date  . Schizophrenia (HCC)   . Schizoaffective disorder,  bipolar type (HCC)   . Cocaine abuse   . Alcohol abuse     Past Surgical History  Procedure Laterality Date  . Abdominal surgery      Family History: History reviewed. No pertinent family history.  Social History:  reports that she has been smoking Cigarettes.  She has been smoking about 0.00 packs per day. She does not have any smokeless tobacco history on file. She reports that she drinks alcohol. She reports that she uses illicit drugs ("Crack" cocaine).  Additional Social History:     CIWA: CIWA-Ar BP: 174/100 mmHg (pt would not take coat off) Pulse Rate: 72 COWS:    PATIENT STRENGTHS: (choose at least two) Average or above average intelligence Physical Health  Allergies: No Known Allergies  Home Medications:  (Not in a hospital admission)  OB/GYN Status:  Patient's last menstrual period was 09/07/2012.  General Assessment Data [facility] of Assessment: [facility] TTS Assessment: In system Is this a Tele or Face-to-Face Assessment?: Tele Assessment Is this an Initial Assessment or a Re-assessment for this encounter?: Initial Assessment Marital status: Single Maiden name: NA Is patient pregnant?: No Pregnancy Status: No Living Arrangements: Other (Comment), Alone (Pt has place to stay but refuses to stay there) Can pt return to current living arrangement?: Yes Admission Status: Voluntary Is patient capable of signing voluntary admission?: Yes Referral Source: Self/Family/Friend Insurance type: Self-pay     Crisis Care Plan Living Arrangements: Other (Comment), Alone (Pt has place to stay but refuses to stay there) Legal Guardian: Other: (None) Name  of Psychiatrist: [facility] Name of Therapist: [facility]  Education Status Is patient currently in school?: No Current Grade: NA Highest grade of school patient has completed: 57 Name of school: NA Contact person: NA  Risk to self with the past 6 months Suicidal Ideation: No Has  patient been a risk to self within the past 6 months prior to admission? : No Suicidal Intent: No Has patient had any suicidal intent within the past 6 months prior to admission? : No Is patient at risk for suicide?: No Suicidal Plan?: No Has patient had any suicidal plan within the past 6 months prior to admission? : No Access to Means: No What has been your use of drugs/alcohol within the last 12 months?: Pt reports using alcohol and crack Previous Attempts/Gestures: Yes How many times?: 5 Other Self Harm Risks: Pt is psychotic and disorganized Triggers for Past Attempts: Unknown Intentional Self Injurious Behavior: None Family Suicide History: Unknown Recent stressful life event(s): Other (Comment) (Poor support) Persecutory voices/beliefs?: Yes Depression: Yes Depression Symptoms: Despondent, Fatigue, Isolating, Loss of interest in usual pleasures, Feeling angry/irritable Substance abuse history and/or treatment for substance abuse?: Yes Suicide prevention information given to non-admitted patients: Not applicable  Risk to Others within the past 6 months Homicidal Ideation: No Thoughts of Harm to Others: No Current Homicidal Intent: No Current Homicidal Plan: No Access to Homicidal Means: No Identified Victim: None History of harm to others?: No Assessment of Violence: None Noted Violent Behavior Description: Pt denies history of violence Does patient have access to weapons?: No Criminal Charges Pending?: No Does patient have a court date: No Is patient on probation?: No  Psychosis Hallucinations: Auditory Delusions: Persecutory  Mental Status Report Appearance/Hygiene: In scrubs, Disheveled Eye Contact: Fair Motor Activity: Restlessness Speech: Loud Level of Consciousness: Alert Mood: Suspicious, Anxious, Fearful Affect: Apprehensive, Anxious Anxiety Level: Moderate Thought Processes: Tangential, Flight of Ideas Judgement: Impaired Orientation: Place,  Person Obsessive Compulsive Thoughts/Behaviors: None  Cognitive Functioning Concentration: Decreased Memory: Recent Intact, Remote Intact IQ: Average Insight: Poor Impulse Control: Poor Appetite: Good Weight Loss: 0 Weight Gain: 0 Sleep: Decreased Total Hours of Sleep: 2 Vegetative Symptoms: Decreased grooming  ADLScreening [facility] Assessment Services) Patient's cognitive ability adequate to safely complete daily activities?: Yes Patient able to express need for assistance with ADLs?: Yes Independently performs ADLs?: Yes (appropriate for developmental age)  Prior Inpatient Therapy Prior Inpatient Therapy: Yes Prior Therapy Dates: Multiple admits Prior Therapy Facilty/Provider(s): [facility], other facilities Reason for Treatment: Psychosis  Prior Outpatient Therapy Prior Outpatient Therapy: Yes Prior Therapy Dates: Current  Prior Therapy Facilty/Provider(s): [facility] Reason for Treatment: Schizophrenia Does patient have an ACCT team?: Yes ([facility] [phone]) Does patient have Intensive In-House Services?  : No Does patient have [facility] services? : No Does patient have P4CC services?: No  ADL Screening (condition at time of admission) Patient's cognitive ability adequate to safely complete daily activities?: Yes Patient able to express need for assistance with ADLs?: Yes Independently performs ADLs?: Yes (appropriate for developmental age)             Advance Directives (For Healthcare) Does patient have an advance directive?: No Would patient like information on creating an advanced directive?: No - patient declined information          Disposition: Binnie Rail, [facility], confirms adult unit is currently at capacity. Gave clinical report to Donell Sievert, PA-C who said Pt meets criteria for inpatient psychiatric treatment. TTS will contact other facilities for  placement. Notified Dr. Lorre NickAnthony Allen and Tresa EndoKelly, RN of  recommendation.  Disposition Disposition of Patient: Other dispositions Other disposition(s): Other (Comment)   Pamalee LeydenFord Ellis Corian Handley Jr, [facility], [facility], [facility] Triage Specialist [phone]   Pamalee LeydenWarrick Jr, Anaid Haney Ellis 12/15/2015 9:39 PM

## 2015-12-15 NOTE — ED Notes (Signed)
TTS talking with patient on machine
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
## 2015-12-15 NOTE — ED Provider Notes (Signed)
CSN: [id number]     Arrival date & time 12/15/15  1652 History   First MD Initiated Contact with Patient 12/15/15 2020     Chief Complaint  Patient presents with  . Depression     (Consider location/radiation/quality/duration/timing/severity/associated sxs/prior Treatment) HPI Comments: Patient here complaining of increase psychosis manifested as was his tone her to harm herself. Denies any active ingestions at this time. She's been noncompliant with his psychiatric medications. According to her act team member per the records patient has been having increase flight of ideas. Patient is not cooperative with this exam.  Patient is a 55 y.o. female presenting with depression. The history is provided by the patient. The history is limited by the condition of the patient.  Depression    Past Medical History  Diagnosis Date  . Schizophrenia (HCC)   . Schizoaffective disorder, bipolar type (HCC)   . Cocaine abuse   . Alcohol abuse    Past Surgical History  Procedure Laterality Date  . Abdominal surgery     History reviewed. No pertinent family history. Social History  Substance Use Topics  . Smoking status: Current Every Day Smoker -- 0.00 packs/day    Types: Cigarettes  . Smokeless tobacco: None  . Alcohol Use: Yes     Comment: patient drinks beer and wine daily   OB History    No data available     Review of Systems  Unable to perform ROS: Psychiatric disorder  Psychiatric/Behavioral: Positive for depression.      Allergies  Review of patient's allergies indicates no known allergies.  Home Medications   Prior to Admission medications   Medication Sig Start Date End Date Taking? Authorizing Provider  paliperidone (INVEGA SUSTENNA) 234 MG/1.5ML SUSP injection Inject 234 mg into the muscle every 30 (thirty) days.   Yes Historical Provider, MD   BP 174/100 mmHg  Pulse 72  Temp(Src) 98.5 F (36.9 C) (Oral)  Resp 20  SpO2 96%  LMP 09/07/2012 Physical Exam   Constitutional: She is oriented to person, place, and time. She appears well-developed and well-nourished.  Non-toxic appearance. No distress.  HENT:  Head: Normocephalic and atraumatic.  Eyes: Conjunctivae, EOM and lids are normal. Pupils are equal, round, and reactive to light.  Neck: Normal range of motion. Neck supple. No tracheal deviation present. No thyroid mass present.  Cardiovascular: Normal rate, regular rhythm and normal heart sounds.  Exam reveals no gallop.   No murmur heard. Pulmonary/Chest: Effort normal and breath sounds normal. No stridor. No respiratory distress. She has no decreased breath sounds. She has no wheezes. She has no rhonchi. She has no rales.  Abdominal: Soft. Normal appearance and bowel sounds are normal. She exhibits no distension. There is no tenderness. There is no rebound and no CVA tenderness.  Musculoskeletal: Normal range of motion. She exhibits no edema or tenderness.  Neurological: She is alert and oriented to person, place, and time. She has normal strength. No cranial nerve deficit or sensory deficit. GCS eye subscore is 4. GCS verbal subscore is 5. GCS motor subscore is 6.  Skin: Skin is warm and dry. No abrasion and no rash noted.  Psychiatric: Her affect is labile. Her speech is delayed. She is withdrawn. Thought content is delusional. She expresses suicidal ideation.  Nursing note and vitals reviewed.   ED Course  Procedures (including critical care time) Labs Review Labs Reviewed  COMPREHENSIVE METABOLIC PANEL - Abnormal; Notable for the following:    Glucose, Bld 129 (*)  AST 13 (*)    ALT 13 (*)    Total Bilirubin <0.1 (*)    All other components within normal limits  ACETAMINOPHEN LEVEL - Abnormal; Notable for the following:    Acetaminophen (Tylenol), Serum <10 (*)    All other components within normal limits  CBC - Abnormal; Notable for the following:    RBC 3.79 (*)    Hemoglobin 11.6 (*)    All other components within normal  limits  ETHANOL  SALICYLATE LEVEL  URINE RAPID DRUG SCREEN, HOSP PERFORMED    Imaging Review No results found. I have personally reviewed and evaluated these images and lab results as part of my medical decision-making.   EKG Interpretation None      MDM   Final diagnoses:  None    Patient is medically cleared at this time and will be dispositioned by psychiatry    Lorre Nick, MD 12/15/15 2046

## 2015-12-15 NOTE — ED Notes (Addendum)
Pt brought in by mental health provider. Pt states she has no where to go and has nothing. Pt continuing to say we are going to take all her stuff, strip her and not give it back. Pt not cooperative in triage. Mental health provider states she is not taking her medications, psychotic, having flight of ideas.

## 2015-12-16 NOTE — [facility] Counselor (Signed)
Counselor faxed out supporting documentation to the following psychiatric facilities for patient placement: [facility]   [place]

## 2015-12-16 NOTE — ED Notes (Signed)
Patient has two bags of belongings in locker 27. 

## 2015-12-16 NOTE — ED Notes (Signed)
Sheriff present to transport pt and report called

## 2015-12-16 NOTE — ED Notes (Addendum)
[facility] is complete, please call report before [facility]  Accepting Dr Sheffield SliderKomissaroba  [phone]

## 2015-12-21 ENCOUNTER — Telehealth: Payer: Self-pay | Admitting: *Deleted

## 2016-05-28 ENCOUNTER — Encounter (HOSPITAL_COMMUNITY): Payer: Self-pay | Admitting: Emergency Medicine

## 2016-05-28 ENCOUNTER — Emergency Department (HOSPITAL_COMMUNITY)
Admission: EM | Admit: 2016-05-28 | Discharge: 2016-05-28 | Discharge status: Against Medical Advice | Payer: Medicaid Other | Attending: Emergency Medicine | Admitting: Emergency Medicine

## 2016-05-28 DIAGNOSIS — F17210 Nicotine dependence, cigarettes, uncomplicated: Secondary | ICD-10-CM | POA: Insufficient documentation

## 2016-05-28 DIAGNOSIS — R0789 Other chest pain: Secondary | ICD-10-CM

## 2016-05-28 DIAGNOSIS — R072 Precordial pain: Secondary | ICD-10-CM | POA: Diagnosis present

## 2016-05-28 MED ORDER — LORAZEPAM 2 MG/ML IJ SOLN: 1.0000 mg | Freq: Once | INTRAMUSCULAR | Status: DC

## 2016-05-28 NOTE — ED Triage Notes (Signed)
Pt here via EMS with sudden onset CP with dizziness and nausea while at rest. Pt describes as sharp central CP with some radiation to left arm. Pt a/o x 4.

## 2016-05-28 NOTE — ED Provider Notes (Signed)
[facility] Provider Note   CSN: [id number] Arrival date & time: 05/28/16  1202     History   Chief Complaint Chief Complaint  Patient presents with  . Chest Pain    HPI [name] is a 55 y.o. female.  HPI   55 year old female with history of cocaine abuse, alcohol abuse, schizophrenia brought here via EMS from home for evaluation of chest pain. History is difficult to obtain as patient is a poor historian and does not want to talk. Patient report she developed sharp midsternal chest pain approximately 2 hours ago while she was standing outside smoking a cigarette. She states her pain is mild and she would prefer to sleep. When asked if she felt nauseous patient states "a little". I asked serious question including sensation of lightheadedness, dizziness shortness of breath or diaphoresis and patient is to the same answer. Patient states she has similar pain like this a month ago that resolved without any treatment. She denies any recent cocaine use. She denies fever, productive cough, hemoptysis, abdominal pain. Per nursing note, patient reports pain radiates to left arm however when I clarified patient states pain is localized to her midsternal region.  Past Medical History:  Diagnosis Date  . Alcohol abuse   . Cocaine abuse   . Schizoaffective disorder, bipolar type (HCC)   . Schizophrenia [facility](HCC)     Patient Active Problem List   Diagnosis Date Noted  . Cocaine abuse 12/08/2015  . 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 Substance induced mood disorder (HCC) 12/29/2014  . Hallucination   . Homicidal ideation   . Suicidal ideation   . Schizophrenia (HCC) 01/26/2014  . Polysubstance abuse 01/26/2014    Past Surgical History:  Procedure Laterality Date  . ABDOMINAL SURGERY      OB History    No data available       Home Medications    Prior to Admission medications   Medication Sig Start Date End Date Taking? Authorizing Provider    paliperidone (INVEGA SUSTENNA) 234 MG/1.5ML SUSP injection Inject 234 mg into the muscle every 30 (thirty) days.    Historical Provider, MD    Family History History reviewed. No pertinent family history.  Social History Social History  Substance Use Topics  . Smoking status: Current Every Day Smoker    Packs/day: 0.00    Types: Cigarettes  . Smokeless tobacco: Never Used  . Alcohol use Yes     Comment: patient drinks beer and wine daily     Allergies   Review of patient's allergies indicates no known allergies.   Review of Systems Review of Systems  All other systems reviewed and are negative.    Physical Exam Updated Vital Signs BP (!) 138/53 (BP Location: Right Arm)   Pulse (!) 59   Temp 97.2 F (36.2 C) (Oral)   Resp 21   LMP 09/07/2012   SpO2 100%   Physical Exam  Constitutional: She appears well-developed and well-nourished. No distress.  Patient laying in bed, sleeping, easily arousable and in no acute discomfort  HENT:  Head: Atraumatic.  Eyes: Conjunctivae are normal.  Neck: Neck supple. No JVD present.  Cardiovascular: Normal rate, regular rhythm and intact distal pulses.   Pulmonary/Chest: Effort normal and breath sounds normal. No respiratory distress. She exhibits no tenderness.  Abdominal: Soft. There is no tenderness.  Musculoskeletal: She exhibits no edema.  Neurological: She is alert.  Skin: No rash noted.  Psychiatric: Her speech is normal. Her affect  is blunt. She is slowed.  Nursing note and vitals reviewed.    ED Treatments / Results  Labs (all labs ordered are listed, but only abnormal results are displayed) Labs Reviewed - No data to display  EKG  EKG Interpretation  Date/Time:  Friday May 28 2016 12:09:01 EDT Ventricular Rate:  58 PR Interval:    QRS Duration: 103 QT Interval:  447 QTC Calculation: 439 R Axis:   39 Text Interpretation:  Sinus rhythm Prolonged PR interval Probable left ventricular hypertrophy  Nonspecific T abnormalities, diffuse leads - more pronounced but not new in the inferior and lateral leads Confirmed by Rhunette CroftNANAVATI, MD, Janey GentaANKIT [id number]) on 05/28/2016 12:29:17 PM       Radiology No results found.  Procedures Procedures (including critical care time)  Medications Ordered in ED Medications - No data to display   Initial Impression / Assessment and Plan / ED Course  I have reviewed the triage vital signs and the nursing notes.  Pertinent labs & imaging results that were available during my care of the patient were reviewed by me and considered in my medical decision making (see chart for details).  Clinical Course    BP 136/61   Pulse 61   Temp 97.2 F (36.2 C) (Oral)   Resp 23   LMP 09/07/2012   SpO2 94%    Final Clinical Impressions(s) / ED Diagnoses   Final diagnoses:  Other chest pain    New Prescriptions New Prescriptions   No medications on file   12:29 PM Patient here with nonspecific midsternal chest pain that started 2 hours ago. Pain is not reproducible on exam. Does have history of cocaine abuse but denies recent cocaine use. Pain sounds atypical for ACS but difficult to obtain hx. Workup initiated.  12:46 PM At this time pt requesting to leave, stating that her pain has resolved and she wants a bus pass to go home.  I explain to pt that we have not complete our evaluation and also explain the risk of leaving including worsening of sxs or potential death.  Pt acknowledge and agrees to leave AMA.  Care discussed with Dr. Rhunette CroftNanavati.   Fayrene HelperBowie Vidit Boissonneault, PA-C 05/28/16 1247    Derwood KaplanAnkit Nanavati, MD 05/29/16 1423

## 2016-05-28 NOTE — ED Notes (Signed)
Pt requesting to leave AMA. EDP aware. Pt sts understanding of risks.

## 2016-05-28 NOTE — ED Triage Notes (Signed)
Pt was given 324 ASA PTA.

## 2016-07-29 ENCOUNTER — Emergency Department (HOSPITAL_COMMUNITY)
Admission: EM | Admit: 2016-07-29 | Discharge: 2016-07-29 | Disposition: A | Discharge status: Home / Self Care | Payer: Medicaid Other | Attending: Emergency Medicine | Admitting: Emergency Medicine

## 2016-07-29 ENCOUNTER — Encounter (HOSPITAL_COMMUNITY): Payer: Self-pay

## 2016-07-29 ENCOUNTER — Emergency Department (HOSPITAL_COMMUNITY): Payer: Medicaid Other

## 2016-07-29 DIAGNOSIS — Y939 Activity, unspecified: Secondary | ICD-10-CM | POA: Insufficient documentation

## 2016-07-29 DIAGNOSIS — M79631 Pain in right forearm: Secondary | ICD-10-CM | POA: Diagnosis not present

## 2016-07-29 DIAGNOSIS — Y999 Unspecified external cause status: Secondary | ICD-10-CM | POA: Diagnosis not present

## 2016-07-29 DIAGNOSIS — S80212A Abrasion, left knee, initial encounter: Secondary | ICD-10-CM | POA: Diagnosis present

## 2016-07-29 DIAGNOSIS — F17210 Nicotine dependence, cigarettes, uncomplicated: Secondary | ICD-10-CM | POA: Insufficient documentation

## 2016-07-29 DIAGNOSIS — Y92410 Unspecified street and highway as the place of occurrence of the external cause: Secondary | ICD-10-CM | POA: Diagnosis not present

## 2016-07-29 DIAGNOSIS — T07XXXA Unspecified multiple injuries, initial encounter: Secondary | ICD-10-CM

## 2016-07-29 MED ORDER — ACETAMINOPHEN 500 MG PO TABS
1000.0000 mg | ORAL_TABLET | Freq: Once | ORAL | Status: AC
  Administered 2016-07-29: 1000 mg via ORAL
  Filled 2016-07-29: qty 2
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
## 2016-07-29 NOTE — Discharge Instructions (Signed)
Take Tylenol as directed for pain. Wash the abrasion on your left knee daily with soap and water and place a thin layer of bacitracin ointment over the wound and cover with a bandage. Signs of infection including redness around the wound, swelling, drainage from the wound or fever. See an urgent care center or your primary care doctor if not feeling better in a week.
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
## 2016-07-29 NOTE — ED Notes (Signed)
Pt refused to sign discharge papers 

## 2016-07-29 NOTE — ED Notes (Signed)
Pt returned from xray and placed on monitor 

## 2016-07-29 NOTE — ED Notes (Signed)
Pt ambulated to the bathroom without any assistance, pt is tolerating po fluids

## 2017-01-09 ENCOUNTER — Emergency Department (HOSPITAL_COMMUNITY)
Admission: EM | Admit: 2017-01-09 | Discharge: 2017-01-10 | Disposition: A | Discharge status: Home / Self Care | Payer: Medicaid Other | Attending: Emergency Medicine | Admitting: Emergency Medicine

## 2017-01-09 ENCOUNTER — Encounter (HOSPITAL_COMMUNITY): Payer: Self-pay

## 2017-01-09 DIAGNOSIS — Z79899 Other long term (current) drug therapy: Secondary | ICD-10-CM | POA: Insufficient documentation

## 2017-01-09 DIAGNOSIS — F1414 Cocaine abuse with cocaine-induced mood disorder: Secondary | ICD-10-CM | POA: Diagnosis present

## 2017-01-09 DIAGNOSIS — F22 Delusional disorders: Secondary | ICD-10-CM | POA: Diagnosis present

## 2017-01-09 DIAGNOSIS — F17210 Nicotine dependence, cigarettes, uncomplicated: Secondary | ICD-10-CM | POA: Diagnosis not present

## 2017-01-09 LAB — COMPREHENSIVE METABOLIC PANEL
ALBUMIN: 4.2 g/dL (ref 3.5–5.0)
ALT: 13 U/L — AB (ref 14–54)
AST: 20 U/L (ref 15–41)
Alkaline Phosphatase: 67 U/L (ref 38–126)
Anion gap: 7 (ref 5–15)
BILIRUBIN TOTAL: 0.7 mg/dL (ref 0.3–1.2)
BUN: 11 mg/dL (ref 6–20)
CO2: 30 mmol/L (ref 22–32)
CREATININE: 0.76 mg/dL (ref 0.44–1.00)
Calcium: 9.6 mg/dL (ref 8.9–10.3)
Chloride: 105 mmol/L (ref 101–111)
GFR calc Af Amer: >60 mL/min (ref >60)
GFR calc non Af Amer: >60 mL/min (ref >60)
Glucose, Bld: 97 mg/dL (ref 65–99)
POTASSIUM: 4.2 mmol/L (ref 3.5–5.1)
Sodium: 142 mmol/L (ref 135–145)
TOTAL PROTEIN: 8.2 g/dL — AB (ref 6.5–8.1)

## 2017-01-09 LAB — CBC WITH DIFFERENTIAL/PLATELET
BASOS ABS: 0 10*3/uL (ref 0.0–0.1)
BASOS PCT: 0 %
EOS ABS: 0 10*3/uL (ref 0.0–0.7)
Eosinophils Relative: 0 %
HEMATOCRIT: 38.6 % (ref 36.0–46.0)
HEMOGLOBIN: 12.8 g/dL (ref 12.0–15.0)
Lymphocytes Relative: 24 %
Lymphs Abs: 2.9 10*3/uL (ref 0.7–4.0)
MCH: 30.5 pg (ref 26.0–34.0)
MCHC: 33.2 g/dL (ref 30.0–36.0)
MCV: 91.9 fL (ref 78.0–100.0)
Monocytes Absolute: 0.6 10*3/uL (ref 0.1–1.0)
Monocytes Relative: 5 %
NEUTROS ABS: 8.7 10*3/uL — AB (ref 1.7–7.7)
NEUTROS PCT: 71 %
Platelets: 334 10*3/uL (ref 150–400)
RBC: 4.2 MIL/uL (ref 3.87–5.11)
RDW: 14.1 % (ref 11.5–15.5)
WBC: 12.1 10*3/uL — AB (ref 4.0–10.5)

## 2017-01-09 LAB — ACETAMINOPHEN LEVEL: Acetaminophen (Tylenol), Serum: <10 ug/mL — ABNORMAL LOW (ref 10–30)

## 2017-01-09 LAB — ETHANOL: Alcohol, Ethyl (B): <5 mg/dL (ref <5)

## 2017-01-09 LAB — SALICYLATE LEVEL: Salicylate Lvl: <7 mg/dL (ref 2.8–30.0)

## 2017-01-09 MED ORDER — LORAZEPAM 1 MG PO TABS
1.0000 mg | ORAL_TABLET | Freq: Three times a day (TID) | ORAL | Status: DC | PRN
  Administered 2017-01-09: 1 mg via ORAL
  Filled 2017-01-09: qty 1

## 2017-01-09 MED ORDER — NICOTINE 21 MG/24HR TD PT24
21.0000 mg | MEDICATED_PATCH | Freq: Every day | TRANSDERMAL | Status: DC
  Administered 2017-01-10: 21 mg via TRANSDERMAL
  Filled 2017-01-09: qty 1

## 2017-01-09 MED ORDER — ONDANSETRON HCL 4 MG PO TABS: 4.0000 mg | ORAL_TABLET | Freq: Three times a day (TID) | ORAL | Status: DC | PRN

## 2017-01-09 MED ORDER — ALUM & MAG HYDROXIDE-SIMETH 200-200-20 MG/5ML PO SUSP: 30.0000 mL | ORAL | Status: DC | PRN
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
## 2017-01-09 NOTE — ED Notes (Signed)
PT ATTEMPTED TO URINATE WITH COLLECTION CONTAINER HOWEVER SHE POURED URINE OUT WITHOUT UA COLLECTED. WILL REATTEMPT

## 2017-01-09 NOTE — BH Assessment (Addendum)
Assessment Note  [name] is an 56 y.o. female that presents this date responding to internal stimuli. This writer attempted to assess patient unsuccessfully. Patient could not be redirected and seems to be responding to internal stimuli. Patient states "just talk to the baby" and cannot be redirected to her room. Patient called this Clinical research associate "the devil" and shots random words at this Clinical research associate. Information for purposes of assessment was obtained from admission notes. Patient is not oriented to time/place.Per notes patient has a history of schizophrenia, alcohol abuse and cocaine use. Pt appears actively psychotic, paranoid and had difficulty answering questions appropriately. Patient's thoughts are disorganized. She denies current suicidal ideation but appears to not fully understand the question. Patient denies thoughts of wanting to harm others, insisting that others are wanting to harm her. Per notes patient is currently receiving services from [facility] at [phone]. Patient has a history of inpatient psychiatric treatment at various area facilities with last admission at [facility] on 12/14/16. Patient is dressed in hospital scrubs and appears disheveled. Patient's mood is suspicious, anxious and highly agitated. Thought process is disorganized and has flight of ideas. Patient is very paranoid appears at times to be responding to internal stimuli. Per notes: "Patient is highly agitated stating her sister "was taking her things."  Patient has a bracelet on that she would not give staff to lock it up. Patient presents with paranoia. She is suspicious of others. She is pacing the unit and is restless. Patient presents from [place] after coming in with active psychosis. Patient has a history of schizophrenia and states that she has not been compliant with her medications. She is responding to internal stimuli which tell her to harm herself. Patient says that she smoked something and does have a  history of cocaine abuse". Case was staffed with [facility] who recommended patient be re-evaluated in the a.m.    Diagnosis: Schizophrenia; Cocaine Use Disorder, Moderate; Alcohol Use Disorder,  Past Medical History:  Past Medical History:  Diagnosis Date  . Alcohol abuse   . Cocaine abuse   . Schizoaffective disorder, bipolar type (HCC)   . Schizophrenia [facility])     Past Surgical History:  Procedure Laterality Date  . ABDOMINAL SURGERY      Family History: No family history on file.  Social History:  reports that she has been smoking Cigarettes.  She has been smoking about 0.00 packs per day. She has never used smokeless tobacco. She reports that she drinks alcohol. She reports that she uses drugs, including "Crack" cocaine and Cocaine.  Additional Social History:  Alcohol / Drug Use Pain Medications: See PTA Prescriptions: See PTA Over the Counter: See PTA History of alcohol / drug use?: Yes Negative Consequences of Use:  (denies) Withdrawal Symptoms:  (denies) Substance #1 Name of Substance 1: Cocaine 1 - Age of First Use: Unknown 1 - Amount (size/oz): Unknown 1 - Frequency: states "whenever I can honey" 1 - Duration: Unknown 1 - Last Use / Amount: Unknown Substance #2 Name of Substance 2: ETOH 2 - Age of First Use: unknown 2 - Amount (size/oz): Unknown 2 - Frequency: "As often as I can get it"  2 - Duration: Ongoing 2 - Last Use / Amount: Unknown  CIWA: CIWA-Ar BP: (!) 133/50 Pulse Rate: 66 COWS:    Allergies: No Known Allergies  Home Medications:  (Not in a hospital admission)  OB/GYN Status:  Patient's last menstrual period was 09/07/2012.  General Assessment Data Location of Assessment: [facility] ED  TTS Assessment: In system Is this a Tele or Face-to-Face Assessment?: Face-to-Face Is this an Initial Assessment or a Re-assessment for this encounter?: Initial Assessment Marital status: Single Maiden name:  (na) Is patient pregnant?: No Pregnancy Status:  No Living Arrangements: Alone Can pt return to current living arrangement?: Yes Admission Status: Voluntary Is patient capable of signing voluntary admission?: Yes Referral Source: Self/Family/Friend Insurance type: Self Pay  Medical Screening Exam [facility] Walk-in ONLY) Medical Exam completed: Yes  Crisis Care Plan Living Arrangements: Alone Legal Guardian:  (na) Name of Psychiatrist: [facility] Name of Therapist: [facility]   Education Status Is patient currently in school?: No Current Grade:  (na) Highest grade of school patient has completed:  (na) Name of school:  (na) Contact person:  (na)  Risk to self with the past 6 months Suicidal Ideation: Yes-Currently Present (per notes) Has patient been a risk to self within the past 6 months prior to admission? :  (UTA) Suicidal Intent:  (UTA) Has patient had any suicidal intent within the past 6 months prior to admission? :  (UTA) Is patient at risk for suicide?: Yes Suicidal Plan?:  (UTA) Has patient had any suicidal plan within the past 6 months prior to admission? :  (UTA) Access to Means:  (UTA) What has been your use of drugs/alcohol within the last 12 months?: Current per history Previous Attempts/Gestures: Yes (per notes) How many times?: 5 (per notes) Other Self Harm Risks:  (UTA) Triggers for Past Attempts:  (UTA) Intentional Self Injurious Behavior:  (UTA) Family Suicide History:  (UTA) Recent stressful life event(s):  (UTA) Persecutory voices/beliefs?:  (UTA) Depression:  (UTA) Depression Symptoms:  (UTA) Substance abuse history and/or treatment for substance abuse?: Yes Suicide prevention information given to non-admitted patients: Not applicable  Risk to Others within the past 6 months Homicidal Ideation:  (UTA) Does patient have any lifetime risk of violence toward others beyond the six months prior to admission? :  (UTA) Thoughts of Harm to Others:  (UTA) Current Homicidal Intent:   (UTA) Current Homicidal Plan:  (UTA) Access to Homicidal Means:  (UTA) Identified Victim:  (UTA) History of harm to others?:  (UTA) Assessment of Violence:  (UTA) Violent Behavior Description:  (UTA) Does patient have access to weapons?:  (UTA) Criminal Charges Pending?:  (UTA) Does patient have a court date:  (UTA) Is patient on probation?:  (UTA)  Psychosis Hallucinations: Auditory (per notes) Delusions: None noted  Mental Status Report Appearance/Hygiene: In scrubs Eye Contact: Poor Motor Activity: Agitation, Hyperactivity Speech: Incoherent Level of Consciousness: Irritable Mood: Anxious, Labile Affect: Anxious Anxiety Level: Severe Thought Processes: Flight of Ideas, Thought Blocking Judgement: Impaired Orientation: Unable to assess Obsessive Compulsive Thoughts/Behaviors: None  Cognitive Functioning Concentration: Decreased Memory: Unable to Assess IQ:  (UTA) Insight: Unable to Assess Impulse Control: Unable to Assess Appetite:  (UTA) Weight Loss:  (UTA) Weight Gain:  (UTA) Sleep:  (UTA) Total Hours of Sleep:  (UTA) Vegetative Symptoms:  (UTA)  ADLScreening [facility]) Patient's cognitive ability adequate to safely complete daily activities?: Yes Patient able to express need for assistance with ADLs?: Yes Independently performs ADLs?: Yes (appropriate for developmental age)  Prior Inpatient Therapy Prior Inpatient Therapy: Yes Prior Therapy Dates: 2018 Prior Therapy Facilty/Provider(s): [facility] Reason for Treatment: MH issues  Prior Outpatient Therapy Prior Outpatient Therapy: Yes (per notes) Prior Therapy Dates: 2018 Prior Therapy Facilty/Provider(s): [facility] Reason for Treatment: MH issues Does patient have an ACCT team?: Yes (per notes) Does patient have Intensive In-House  Services?  : No Does patient have [facility] services? : No Does patient have P4CC services?: No  ADL Screening (condition at time of  admission) Patient's cognitive ability adequate to safely complete daily activities?: Yes Is the patient deaf or have difficulty hearing?: No Does the patient have difficulty seeing, even when wearing glasses/contacts?: No Does the patient have difficulty concentrating, remembering, or making decisions?: No Patient able to express need for assistance with ADLs?: Yes Does the patient have difficulty dressing or bathing?: No Independently performs ADLs?: Yes (appropriate for developmental age) Does the patient have difficulty walking or climbing stairs?: No Weakness of Legs: None Weakness of Arms/Hands: None  Home Assistive Devices/Equipment Home Assistive Devices/Equipment: None  Therapy Consults (therapy consults require a physician order) PT Evaluation Needed: No OT Evalulation Needed: No SLP Evaluation Needed: No Abuse/Neglect Assessment (Assessment to be complete while patient is alone) Physical Abuse: Denies Verbal Abuse: Denies Sexual Abuse: Denies Exploitation of patient/patient's resources: Denies Self-Neglect: Denies Values / Beliefs Cultural Requests During Hospitalization: None Spiritual Requests During Hospitalization: None Consults Spiritual Care Consult Needed: No Social Work Consult Needed: No Merchant navy officer (For Healthcare) Does Patient Have a Medical Advance Directive?: No Would patient like information on creating a medical advance directive?: No - Patient declined    Additional Information 1:1 In Past 12 Months?: No CIRT Risk: No Elopement Risk: No Does patient have medical clearance?: Yes     Disposition: Case was staffed with [facility] who recommended patient be re-evaluated in the a.m.   Disposition Initial Assessment Completed for this Encounter: Yes Disposition of Patient: Other dispositions Other disposition(s): Other (Comment) (re-evaluated in the a.m.)  On Site Evaluation by:   Reviewed with Physician:    Alfredia Ferguson 01/09/2017  4:17 PM

## 2017-01-09 NOTE — ED Provider Notes (Signed)
[facility]-EMERGENCY DEPT Provider Note   CSN: [id number] Arrival date & time: 01/09/17  4098     History   Chief Complaint Chief Complaint  Patient presents with  . Medical Clearance  . Hallucinations  . Paranoid    HPI TAURUS WILLIS is a 56 y.o. female.  56 year old female presents from [place] after coming in with is that the psychosis. Patient has a history of schizophrenia and states that she has not been compliant with her medications. She has numerous spine 2 internal stimuli which tell her to harm herself. She denies any ingestions at this time. Patient says that she smoked something and does have a history of cocaine abuse. Denies abdominal chest discomfort. Denies any dyspnea. Recent fever or chills. History is limited due to her current state.      Past Medical History:  Diagnosis Date  . Alcohol abuse   . Cocaine abuse   . Schizoaffective disorder, bipolar type (HCC)   . Schizophrenia [facility])     Patient Active Problem List   Diagnosis Date Noted  . Cocaine abuse 12/08/2015  . 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 Substance induced mood disorder (HCC) 12/29/2014  . Hallucination   . Homicidal ideation   . Suicidal ideation   . Schizophrenia (HCC) 01/26/2014  . Polysubstance abuse 01/26/2014    Past Surgical History:  Procedure Laterality Date  . ABDOMINAL SURGERY      OB History    No data available       Home Medications    Prior to Admission medications   Medication Sig Start Date End Date Taking? Authorizing Provider  paliperidone (INVEGA SUSTENNA) 234 MG/1.5ML SUSP injection Inject 234 mg into the muscle every 30 (thirty) days.    Historical Provider, MD    Family History No family history on file.  Social History Social History  Substance Use Topics  . Smoking status: Current Every Day Smoker    Packs/day: 0.00    Types: Cigarettes  . Smokeless tobacco: Never Used  . Alcohol use Yes     Comment: patient drinks beer and  wine daily     Allergies   Patient has no known allergies.   Review of Systems Review of Systems  Unable to perform ROS: Psychiatric disorder     Physical Exam Updated Vital Signs BP 126/86 (BP Location: Left Arm)   Pulse 75   Temp 98 F (36.7 C) (Oral)   Resp 20   LMP 09/07/2012   SpO2 100%   Physical Exam  Constitutional: She is oriented to person, place, and time. She appears well-developed and well-nourished.  Non-toxic appearance. No distress.  HENT:  Head: Normocephalic and atraumatic.  Eyes: Conjunctivae, EOM and lids are normal. Pupils are equal, round, and reactive to light.  Neck: Normal range of motion. Neck supple. No tracheal deviation present. No thyroid mass present.  Cardiovascular: Normal rate, regular rhythm and normal heart sounds.  Exam reveals no gallop.   No murmur heard. Pulmonary/Chest: Effort normal and breath sounds normal. No stridor. No respiratory distress. She has no decreased breath sounds. She has no wheezes. She has no rhonchi. She has no rales.  Abdominal: Soft. Normal appearance and bowel sounds are normal. She exhibits no distension. There is no tenderness. There is no rebound and no CVA tenderness.  Musculoskeletal: Normal range of motion. She exhibits no edema or tenderness.  Neurological: She is alert and oriented to person, place, and time. She has normal strength. No cranial nerve deficit  or sensory deficit. GCS eye subscore is 4. GCS verbal subscore is 5. GCS motor subscore is 6.  Skin: Skin is warm and dry. No abrasion and no rash noted.  Psychiatric: Her affect is labile. Her speech is tangential. She is slowed.  Nursing note and vitals reviewed.    ED Treatments / Results  Labs (all labs ordered are listed, but only abnormal results are displayed) Labs Reviewed  CBC WITH DIFFERENTIAL/PLATELET  COMPREHENSIVE METABOLIC PANEL  ETHANOL  RAPID URINE DRUG SCREEN, HOSP PERFORMED    EKG  EKG Interpretation None        Radiology No results found.  Procedures Procedures (including critical care time)  Medications Ordered in ED Medications - No data to display   Initial Impression / Assessment and Plan / ED Course  I have reviewed the triage vital signs and the nursing notes.  Pertinent labs & imaging results that were available during my care of the patient were reviewed by me and considered in my medical decision making (see chart for details).     Patient medically clear for psychiatric disposition  Final Clinical Impressions(s) / ED Diagnoses   Final diagnoses:  None    New Prescriptions New Prescriptions   No medications on file     Lorre Nick, MD 01/09/17 1042

## 2017-01-09 NOTE — ED Notes (Signed)
Hourly rounding reveals patient sleeping in room. No complaints, stable, in no acute distress. Q15 minute rounds and monitoring via Security Cameras to continue. 

## 2017-01-09 NOTE — ED Notes (Signed)
RN AWARE OF NEED FOR URINE

## 2017-01-09 NOTE — ED Notes (Signed)
gl

## 2017-01-09 NOTE — ED Notes (Signed)
Patient has disorganized speech.  She is restless and hyperactive.  She is observed pacing the hallway and speaking to female peers.  She became agitated and was given an ativan 1 mg at 1534 with good results.

## 2017-01-09 NOTE — BH Assessment (Signed)
[facility] Assessment Progress Note  Case was staffed with Rankin who recommended patient be re-evaluated in the a.m.         

## 2017-01-09 NOTE — ED Notes (Addendum)
Pt was sleeping, 1800 vitals were not taken. Attending RN notified.

## 2017-01-09 NOTE — ED Notes (Signed)
ED Provider at bedside. 

## 2017-01-09 NOTE — ED Notes (Signed)
Report to include situation, background, assessment and recommendations from Caroline RN. Patient sleeping, respirations regular and unlabored. Q15 minute rounds and security camera observation to continue.    

## 2017-01-10 DIAGNOSIS — F1414 Cocaine abuse with cocaine-induced mood disorder: Secondary | ICD-10-CM | POA: Diagnosis not present

## 2017-01-10 DIAGNOSIS — F17210 Nicotine dependence, cigarettes, uncomplicated: Secondary | ICD-10-CM

## 2017-01-10 DIAGNOSIS — Z79899 Other long term (current) drug therapy: Secondary | ICD-10-CM

## 2017-01-10 NOTE — ED Notes (Signed)
Hourly rounding reveals patient sleeping in room. No complaints, stable, in no acute distress. Q15 minute rounds and monitoring via Security Cameras to continue. 

## 2017-01-10 NOTE — ED Notes (Signed)
Patient discharged to home.  She denies thoughts of harm to self or others.  All belongings returned and signed for.  She left the unit ambulatory with her ACT team member.  She was escorted to the front lobby.

## 2017-01-10 NOTE — [facility] Suicide Risk Assessment (Signed)
Suicide Risk Assessment  Discharge Assessment   [facility] Discharge Suicide Risk Assessment   Principal Problem: Cocaine abuse with cocaine-induced mood disorder [facility]) Discharge Diagnoses:  Patient Active Problem List   Diagnosis Date Noted  . Cocaine abuse with cocaine-induced mood disorder [facility]) [F14.14] 12/08/2015    Priority: High  . Schizophrenia (HCC) [F20.9] 01/26/2014    Priority: High  . Polysubstance abuse [F19.10] 01/26/2014    Priority: High  . Substance induced mood disorder (HCC) [F19.94] 12/29/2014  . Hallucination [R44.3]   . Homicidal ideation [R45.850]   . Suicidal ideation [R45.851]     Total Time spent with patient: 45 minutes  Musculoskeletal: Strength & Muscle Tone: within normal limits Gait & Station: normal Patient leans: N/A  Psychiatric Specialty Exam: Physical Exam  Constitutional: She is oriented to person, place, and time. She appears well-developed and well-nourished.  HENT:  Head: Normocephalic.  Neck: Normal range of motion.  Respiratory: Effort normal.  Musculoskeletal: Normal range of motion.  Neurological: She is alert and oriented to person, place, and time.  Psychiatric: She has a normal mood and affect. Her speech is normal and behavior is normal. Judgment and thought content normal. Cognition and memory are normal.    Review of Systems  Psychiatric/Behavioral: Positive for substance abuse.  All other systems reviewed and are negative.   Blood pressure 128/89, pulse 77, temperature 97.7 F (36.5 C), temperature source Oral, resp. rate 16, last menstrual period 09/07/2012, SpO2 96 %.There is no height or weight on file to calculate BMI.  General Appearance: Casual  Eye Contact:  Good  Speech:  Normal Rate  Volume:  Normal  Mood:  Irritable, mild  Affect:  Congruent  Thought Process:  Coherent and Descriptions of Associations: Intact  Orientation:  Full (Time, Place, and Person)  Thought Content:  WDL and Logical  Suicidal Thoughts:   No  Homicidal Thoughts:  No  Memory:  Immediate;   Good Recent;   Fair Remote;   Good  Judgement:  Fair  Insight:  Fair  Psychomotor Activity:  Normal  Concentration:  Concentration: Good and Attention Span: Good  Recall:  Good  Fund of Knowledge:  Fair  Language:  Good  Akathisia:  No  Handed:  Right  AIMS (if indicated):     Assets:  Leisure Time Physical Health Resilience Social Support  ADL's:  Intact  Cognition:  WNL  Sleep:       Mental Status Per Nursing Assessment::   On Admission:   cocaine and alcohol abuse with paranoia  Demographic Factors:  Living alone  Loss Factors: NA  Historical Factors: NA  Risk Reduction Factors:   Sense of responsibility to family, Positive social support and Positive therapeutic relationship  Continued Clinical Symptoms:  Irritable, mild  Cognitive Features That Contribute To Risk:  None    Suicide Risk:  Minimal: No identifiable suicidal ideation.  Patients presenting with no risk factors but with morbid ruminations; may be classified as minimal risk based on the severity of the depressive symptoms    Plan Of Care/Follow-up recommendations:  Activity:  as tolerated Diet:  heart healthy diet  Ayelen Sciortino, NP 01/10/2017, 1:22 PM

## 2017-01-10 NOTE — Consult Note (Signed)
[facility] Face-to-Face Psychiatry Consult   Reason for Consult:  Cocaine abuse with paranoia Referring Physician:  EDP Patient Identification: [name] MRN:  [id number] Principal Diagnosis: Cocaine abuse with cocaine-induced mood disorder [facility]) Diagnosis:   Patient Active Problem List   Diagnosis Date Noted  . Cocaine abuse with cocaine-induced mood disorder [facility]) [F14.14] 12/08/2015    Priority: High  . Schizophrenia ([place]) [F20.9] 01/26/2014    Priority: High  . Polysubstance abuse [F19.10] 01/26/2014    Priority: High  . Substance induced mood disorder ([place]) [F19.94] 12/29/2014  . Hallucination [R44.3]   . Homicidal ideation [R45.850]   . Suicidal ideation [R45.851]     Total Time spent with patient: 45 minutes  Subjective:   [name] is a 56 y.o. female patient does not warrant admission.  HPI:  56 yo female who presented to the ED after using cocaine and alcohol with paranoia.  Today, she is at her baseline with no suicidal/homicidal ideations, hallucinations, paranoia, or withdrawal symptoms.  She is seen by her ACT team, [facility], and notified about her being in the ED.  Stable for discharge.  Past Psychiatric History: substance abuse, suicide  Risk to Self: None Risk to Others: None Prior Inpatient Therapy: Prior Inpatient Therapy: Yes Prior Therapy Dates: 2018 Prior Therapy Facilty/Provider(s): [facility] Reason for Treatment: MH issues Prior Outpatient Therapy: Prior Outpatient Therapy: Yes (per notes) Prior Therapy Dates: 2018 Prior Therapy Facilty/Provider(s): [facility] Reason for Treatment: MH issues Does patient have an ACCT team?: Yes (per notes) Does patient have Intensive In-House Services?  : No Does patient have [facility] services? : No Does patient have P4CC services?: No  Past Medical History:  Past Medical History:  Diagnosis Date  . Alcohol abuse   . Cocaine abuse   . Schizoaffective disorder, bipolar type ([place])   . Schizophrenia  [facility])     Past Surgical History:  Procedure Laterality Date  . ABDOMINAL SURGERY     Family History: No family history on file. Family Psychiatric  History: unknown Social History:  History  Alcohol Use  . Yes    Comment: patient drinks beer and wine daily     History  Drug Use  . Types: "Crack" cocaine, Cocaine    Comment: crack use daily    Social History   Social History  . Marital status: Single    Spouse name: N/A  . Number of children: N/A  . Years of education: N/A   Social History Main Topics  . Smoking status: Current Every Day Smoker    Packs/day: 0.00    Types: Cigarettes  . Smokeless tobacco: Never Used  . Alcohol use Yes     Comment: patient drinks beer and wine daily  . Drug use: Yes    Types: "Crack" cocaine, Cocaine     Comment: crack use daily  . Sexual activity: Not Asked   Other Topics Concern  . None   Social History Narrative  . None   Additional Social History:    Allergies:  No Known Allergies  Labs:  Results for orders placed or performed during the hospital encounter of 01/09/17 (from the past 48 hour(s))  CBC with Differential/Platelet     Status: Abnormal   Collection Time: 01/09/17  9:45 AM  Result Value Ref Range   WBC 12.1 (H) 4.0 - 10.5 K/uL   RBC 4.20 3.87 - 5.11 MIL/uL   Hemoglobin 12.8 12.0 - 15.0 g/dL   HCT 38.6 36.0 - 46.0 %  MCV 91.9 78.0 - 100.0 fL   MCH 30.5 26.0 - 34.0 pg   MCHC 33.2 30.0 - 36.0 g/dL   RDW 14.1 11.5 - 15.5 %   Platelets 334 150 - 400 K/uL   Neutrophils Relative % 71 %   Neutro Abs 8.7 (H) 1.7 - 7.7 K/uL   Lymphocytes Relative 24 %   Lymphs Abs 2.9 0.7 - 4.0 K/uL   Monocytes Relative 5 %   Monocytes Absolute 0.6 0.1 - 1.0 K/uL   Eosinophils Relative 0 %   Eosinophils Absolute 0.0 0.0 - 0.7 K/uL   Basophils Relative 0 %   Basophils Absolute 0.0 0.0 - 0.1 K/uL  Comprehensive metabolic panel     Status: Abnormal   Collection Time: 01/09/17  9:45 AM  Result Value Ref Range   Sodium 142  135 - 145 mmol/L   Potassium 4.2 3.5 - 5.1 mmol/L   Chloride 105 101 - 111 mmol/L   CO2 30 22 - 32 mmol/L   Glucose, Bld 97 65 - 99 mg/dL   BUN 11 6 - 20 mg/dL   Creatinine, Ser 0.76 0.44 - 1.00 mg/dL   Calcium 9.6 8.9 - 10.3 mg/dL   Total Protein 8.2 (H) 6.5 - 8.1 g/dL   Albumin 4.2 3.5 - 5.0 g/dL   AST 20 15 - 41 U/L   ALT 13 (L) 14 - 54 U/L   Alkaline Phosphatase 67 38 - 126 U/L   Total Bilirubin 0.7 0.3 - 1.2 mg/dL   GFR calc non Af Amer >60 >60 mL/min   GFR calc Af Amer >60 >60 mL/min    Comment: (NOTE) The eGFR has been calculated using the CKD EPI equation. This calculation has not been validated in all clinical situations. eGFR's persistently <60 mL/min signify possible Chronic Kidney Disease.    Anion gap 7 5 - 15  Ethanol     Status: None   Collection Time: 01/09/17  9:45 AM  Result Value Ref Range   Alcohol, Ethyl (B) <5 <5 mg/dL    Comment:        LOWEST DETECTABLE LIMIT FOR SERUM ALCOHOL IS 5 mg/dL FOR MEDICAL PURPOSES ONLY   Salicylate level     Status: None   Collection Time: 01/09/17  9:45 AM  Result Value Ref Range   Salicylate Lvl <6.2 2.8 - 30.0 mg/dL  Acetaminophen level     Status: Abnormal   Collection Time: 01/09/17  9:45 AM  Result Value Ref Range   Acetaminophen (Tylenol), Serum <10 (L) 10 - 30 ug/mL    Comment:        THERAPEUTIC CONCENTRATIONS VARY SIGNIFICANTLY. A RANGE OF 10-30 ug/mL MAY BE AN EFFECTIVE CONCENTRATION FOR MANY PATIENTS. HOWEVER, SOME ARE BEST TREATED AT CONCENTRATIONS OUTSIDE THIS RANGE. ACETAMINOPHEN CONCENTRATIONS >150 ug/mL AT 4 HOURS AFTER INGESTION AND >50 ug/mL AT 12 HOURS AFTER INGESTION ARE OFTEN ASSOCIATED WITH TOXIC REACTIONS.     Current Facility-Administered Medications  Medication Dose Route Frequency Provider Last Rate Last Dose  . alum & mag hydroxide-simeth (MAALOX/MYLANTA) 200-200-20 MG/5ML suspension 30 mL  30 mL Oral PRN Lacretia Leigh, MD      . nicotine (NICODERM CQ - dosed in mg/24 hours)  patch 21 mg  21 mg Transdermal Daily Lacretia Leigh, MD   21 mg at 01/10/17 1012  . ondansetron (ZOFRAN) tablet 4 mg  4 mg Oral Q8H PRN Lacretia Leigh, MD       Current Outpatient Prescriptions  Medication Sig Dispense Refill  .  haloperidol decanoate (HALDOL DECANOATE) 100 MG/ML injection Inject 100 mg into the muscle every 28 (twenty-eight) days.      Musculoskeletal: Strength & Muscle Tone: within normal limits Gait & Station: normal Patient leans: N/A  Psychiatric Specialty Exam: Physical Exam  Constitutional: She is oriented to person, place, and time. She appears well-developed and well-nourished.  HENT:  Head: Normocephalic.  Neck: Normal range of motion.  Respiratory: Effort normal.  Musculoskeletal: Normal range of motion.  Neurological: She is alert and oriented to person, place, and time.  Psychiatric: She has a normal mood and affect. Her speech is normal and behavior is normal. Judgment and thought content normal. Cognition and memory are normal.    Review of Systems  Psychiatric/Behavioral: Positive for substance abuse.  All other systems reviewed and are negative.   Blood pressure 128/89, pulse 77, temperature 97.7 F (36.5 C), temperature source Oral, resp. rate 16, last menstrual period 09/07/2012, SpO2 96 %.There is no height or weight on file to calculate BMI.  General Appearance: Casual  Eye Contact:  Good  Speech:  Normal Rate  Volume:  Normal  Mood:  Irritable, mild  Affect:  Congruent  Thought Process:  Coherent and Descriptions of Associations: Intact  Orientation:  Full (Time, Place, and Person)  Thought Content:  WDL and Logical  Suicidal Thoughts:  No  Homicidal Thoughts:  No  Memory:  Immediate;   Good Recent;   Fair Remote;   Good  Judgement:  Fair  Insight:  Fair  Psychomotor Activity:  Normal  Concentration:  Concentration: Good and Attention Span: Good  Recall:  Good  Fund of Knowledge:  Fair  Language:  Good  Akathisia:  No  Handed:   Right  AIMS (if indicated):     Assets:  Leisure Time Physical Health Resilience Social Support  ADL's:  Intact  Cognition:  WNL  Sleep:        Treatment Plan Summary: Daily contact with patient to assess and evaluate symptoms and progress in treatment, Medication management and Plan cocaine abuse with cocaine induced mood disorder:  -Crisis stabilization -Medication management:  No medications started as she needed to clear her substances -Individual and substance abuse counseling  Disposition: No evidence of imminent risk to self or others at present.    Waylan Boga, NP 01/10/2017 10:43 AM  Patient seen face-to-face for psychiatric evaluation, chart reviewed and case discussed with the physician extender and developed treatment plan. Reviewed the information documented and agree with the treatment plan. Corena Pilgrim, MD

## 2017-01-10 NOTE — Discharge Instructions (Signed)
For your ongoing behavioral health needs, you are advised to continue treatment with the [facility] ACT Team: ° °     [facility] °     [address] °     [place], [place] [address] °     [phone] °

## 2017-01-10 NOTE — BH Assessment (Signed)
[facility] Assessment Progress Note  Per Thedore Mins, MD, this pt does not require psychiatric hospitalization at this time.  Pt is to be discharged from [facility] with recommendation to continue treatment with the [facility] ACT Team, her current outpatient provider.  This has been included in pt's discharge instructions.  At 11:18 this Clinical research associate called [facility] and spoke to [place], who agrees to have staff come to the ED to pick pt up.  Pt's nurse, Dawnaly, has been notified.  Doylene Canning, MA Triage Specialist [phone]

## 2017-01-11 ENCOUNTER — Emergency Department (HOSPITAL_COMMUNITY): Payer: Medicaid Other

## 2017-01-11 ENCOUNTER — Emergency Department (HOSPITAL_COMMUNITY)
Admission: EM | Admit: 2017-01-11 | Discharge: 2017-01-11 | Disposition: A | Discharge status: Home / Self Care | Payer: Medicaid Other | Attending: Emergency Medicine | Admitting: Emergency Medicine

## 2017-01-11 ENCOUNTER — Encounter (HOSPITAL_COMMUNITY): Payer: Self-pay | Admitting: *Deleted

## 2017-01-11 DIAGNOSIS — R45851 Suicidal ideations: Secondary | ICD-10-CM | POA: Diagnosis present

## 2017-01-11 DIAGNOSIS — Z79899 Other long term (current) drug therapy: Secondary | ICD-10-CM | POA: Diagnosis not present

## 2017-01-11 DIAGNOSIS — R079 Chest pain, unspecified: Secondary | ICD-10-CM | POA: Insufficient documentation

## 2017-01-11 DIAGNOSIS — F17210 Nicotine dependence, cigarettes, uncomplicated: Secondary | ICD-10-CM | POA: Diagnosis not present

## 2017-01-11 DIAGNOSIS — F1410 Cocaine abuse, uncomplicated: Secondary | ICD-10-CM

## 2017-01-11 DIAGNOSIS — F1414 Cocaine abuse with cocaine-induced mood disorder: Secondary | ICD-10-CM | POA: Diagnosis not present

## 2017-01-11 LAB — COMPREHENSIVE METABOLIC PANEL
ALBUMIN: 3.9 g/dL (ref 3.5–5.0)
ALK PHOS: 64 U/L (ref 38–126)
ALT: 13 U/L — ABNORMAL LOW (ref 14–54)
AST: 20 U/L (ref 15–41)
Anion gap: 7 (ref 5–15)
BILIRUBIN TOTAL: 0.5 mg/dL (ref 0.3–1.2)
BUN: 12 mg/dL (ref 6–20)
CO2: 27 mmol/L (ref 22–32)
Calcium: 9.4 mg/dL (ref 8.9–10.3)
Chloride: 107 mmol/L (ref 101–111)
Creatinine, Ser: 0.69 mg/dL (ref 0.44–1.00)
GFR calc Af Amer: >60 mL/min (ref >60)
GFR calc non Af Amer: >60 mL/min (ref >60)
GLUCOSE: 104 mg/dL — AB (ref 65–99)
POTASSIUM: 3.5 mmol/L (ref 3.5–5.1)
Sodium: 141 mmol/L (ref 135–145)
TOTAL PROTEIN: 7.6 g/dL (ref 6.5–8.1)

## 2017-01-11 LAB — CBC WITH DIFFERENTIAL/PLATELET
Basophils Absolute: 0 10*3/uL (ref 0.0–0.1)
Basophils Relative: 0 %
Eosinophils Absolute: 0.1 10*3/uL (ref 0.0–0.7)
Eosinophils Relative: 1 %
HCT: 35.1 % — ABNORMAL LOW (ref 36.0–46.0)
Hemoglobin: 11.9 g/dL — ABNORMAL LOW (ref 12.0–15.0)
Lymphocytes Relative: 33 %
Lymphs Abs: 3.6 10*3/uL (ref 0.7–4.0)
MCH: 30.2 pg (ref 26.0–34.0)
MCHC: 33.9 g/dL (ref 30.0–36.0)
MCV: 89.1 fL (ref 78.0–100.0)
Monocytes Absolute: 0.9 10*3/uL (ref 0.1–1.0)
Monocytes Relative: 8 %
Neutro Abs: 6.4 10*3/uL (ref 1.7–7.7)
Neutrophils Relative %: 58 %
Platelets: 313 10*3/uL (ref 150–400)
RBC: 3.94 MIL/uL (ref 3.87–5.11)
RDW: 13.7 % (ref 11.5–15.5)
WBC: 10.9 10*3/uL — ABNORMAL HIGH (ref 4.0–10.5)

## 2017-01-11 LAB — RAPID URINE DRUG SCREEN, HOSP PERFORMED
Amphetamines: NOT DETECTED
Barbiturates: NOT DETECTED
Benzodiazepines: NOT DETECTED
Cocaine: NOT DETECTED
Opiates: NOT DETECTED
Tetrahydrocannabinol: NOT DETECTED

## 2017-01-11 LAB — SALICYLATE LEVEL

## 2017-01-11 LAB — TROPONIN I: Troponin I: <0.03 ng/mL (ref <0.03)

## 2017-01-11 LAB — VALPROIC ACID LEVEL: Valproic Acid Lvl: 45 ug/mL — ABNORMAL LOW (ref 50.0–100.0)

## 2017-01-11 LAB — ACETAMINOPHEN LEVEL: Acetaminophen (Tylenol), Serum: <10 ug/mL — ABNORMAL LOW (ref 10–30)

## 2017-01-11 LAB — ETHANOL: Alcohol, Ethyl (B): <5 mg/dL (ref <5)

## 2017-01-11 NOTE — ED Notes (Signed)
Patient transported to X-ray 

## 2017-01-11 NOTE — Discharge Instructions (Signed)
For your ongoing behavioral health needs, you are advised to continue treatment with the [facility] ACT Team: ° °     [facility] °     [address] °     [place], [place] [address] °     [phone] °

## 2017-01-11 NOTE — ED Notes (Signed)
Bed: UJ81 Expected date:  Expected time:  Means of arrival:  Comments: 56 yo F/ psych

## 2017-01-11 NOTE — BH Assessment (Signed)
[facility] Assessment Progress Note  Per Thedore Mins, MD, this pt does not require psychiatric hospitalization at this time.  Pt is to be discharged from [facility] with recommendation to continue treatment with the [facility] ACT Team, her current outpatient provider.  This has been included in pt's discharge instructions.  At 10:19 this Clinical research associate called [facility] and spoke to [place], who agrees to have staff call me to arrange for pt to be picked up.  Pt is to be discharged by 13:00 if [facility] has not arrived by that time.  Pt's nurse has been notified.  Doylene Canning, MA Triage Specialist [phone]

## 2017-01-11 NOTE — ED Notes (Signed)
Pt questioning if friend is still in the dept.

## 2017-01-11 NOTE — [facility] Suicide Risk Assessment (Signed)
Suicide Risk Assessment  Discharge Assessment   [facility] Discharge Suicide Risk Assessment   Principal Problem: Cocaine abuse with cocaine-induced mood disorder [facility]) Discharge Diagnoses:  Patient Active Problem List   Diagnosis Date Noted  . Cocaine abuse with cocaine-induced mood disorder [facility]) [F14.14] 12/08/2015    Priority: High  . Schizophrenia ([facility]) [F20.9] 01/26/2014    Priority: High  . Polysubstance abuse [F19.10] 01/26/2014    Priority: High  . Substance induced mood disorder ([facility]) [F19.94] 12/29/2014  . Hallucination [R44.3]   . Homicidal ideation [R45.850]   . Suicidal ideation [R45.851]     Total Time spent with patient: 45 minutes  Musculoskeletal: Strength & Muscle Tone: within normal limits Gait & Station: normal Patient leans: N/A  Psychiatric Specialty Exam:   Blood pressure (!) 147/57, temperature 98.3 F (36.8 C), temperature source Oral, resp. rate 20, height  (1.702 m), last menstrual period 09/07/2012, SpO2 100 %.There is no height or weight on file to calculate BMI.  General Appearance: Casual  Eye Contact::  Good  Speech:  Normal Rate409  Volume:  Normal  Mood:  Irritable  Affect:  Blunt  Thought Process:  Coherent and Descriptions of Associations: Intact  Orientation:  Full (Time, Place, and Person)  Thought Content:  WDL and Logical  Suicidal Thoughts:  No  Homicidal Thoughts:  No  Memory:  Immediate;   Good Recent;   Good Remote;   Good  Judgement:  Fair  Insight:  Fair  Psychomotor Activity:  Normal  Concentration:  Good  Recall:  Good  Fund of Knowledge:Fair  Language: Good  Akathisia:  No  Handed:  Right  AIMS (if indicated):     Assets:  Leisure Time Physical Health Resilience  Sleep:     Cognition: WNL  ADL's:  Intact   Mental Status Per Nursing Assessment::   On Admission:   cocaine abuse with hallucinations on admission.  None on assessment, "I just want to sleep."  ACT team notified.  No suicidal/homicidal  ideations,hallucinations, or alcohol/drug withdrawal symptoms, stable for discharge.  Demographic Factors:  NA  Loss Factors: NA  Historical Factors: NA  Risk Reduction Factors:   Sense of responsibility to family and Positive social support  Continued Clinical Symptoms:  Irritable  Cognitive Features That Contribute To Risk:  None    Suicide Risk:  Minimal: No identifiable suicidal ideation.  Patients presenting with no risk factors but with morbid ruminations; may be classified as minimal risk based on the severity of the depressive symptoms    Plan Of Care/Follow-up recommendations:  Activity:  as tolerated Diet:  heart healthy diet  Iyania Denne, NP 01/11/2017, 10:17 AM

## 2017-01-11 NOTE — ED Triage Notes (Signed)
Pt stating "I want detox from alcohol and everything.  I came in with my friend, she's here for detox too.  I want to check on my meds.  They're not working."

## 2017-01-11 NOTE — ED Notes (Signed)
Patient waiting for ride from [facility].

## 2017-01-11 NOTE — ED Provider Notes (Signed)
[facility]-EMERGENCY DEPT Provider Note   CSN: [id number] Arrival date & time: 01/11/17  0258   By signing my name below, I, [name], attest that this documentation has been prepared under the direction and in the presence of Danielle Octave, MD. Electronically signed, [name], ED Scribe. 01/11/17. 3:59 AM.   History   Chief Complaint Chief Complaint  Patient presents with  . detox   The history is provided by the patient and medical records. No language interpreter was used.    HPI Comments: [name] is a 56 y.o. female with Hx of schizophrenia who presents to the Emergency Department complaining of worsened recurrent suicidal ideations without plan x > 15 hours. She notes associated intermittent non radiating central chest pain since yesterday lasting 25-30 minutes at a time that is improved with breathing and is less mild than previous episodes of this pain; auditory hallucinations, stating she is hearing voices; homicidal ideation without noted plan and abdominal pain. Pt taking prescribed prolixin and Depakote and she states she took her last dosages of each last night. Pt "smoke[s] crack", and states she last used on 01/09/2017. Pt notes Hx of daily tobacco use. She denies alcohol or illicit drug use within the past 24 hours, constipation, diarrhea, decreased appetite and Hx of heart or lung disorders.   Past Medical History:  Diagnosis Date  . Alcohol abuse   . Cocaine abuse   . Schizoaffective disorder, bipolar type ([facility])   . Schizophrenia [facility])     Patient Active Problem List   Diagnosis Date Noted  . Cocaine abuse with cocaine-induced mood disorder ([facility]) 12/08/2015  . Substance induced mood disorder ([facility]) 12/29/2014  . Hallucination   . Homicidal ideation   . Suicidal ideation   . Schizophrenia ([facility]) 01/26/2014  . Polysubstance abuse 01/26/2014    Past Surgical History:  Procedure Laterality Date  . ABDOMINAL SURGERY      OB History    No data  available       Home Medications    Prior to Admission medications   Medication Sig Start Date End Date Taking? Authorizing Provider  haloperidol decanoate (HALDOL DECANOATE) 100 MG/ML injection Inject 100 mg into the muscle every 28 (twenty-eight) days.   Yes Historical Provider, MD    Family History No family history on file.  Social History Social History  Substance Use Topics  . Smoking status: Current Every Day Smoker    Packs/day: 0.00    Types: Cigarettes  . Smokeless tobacco: Never Used  . Alcohol use Yes     Comment: patient drinks beer and wine daily     Allergies   Patient has no known allergies.   Review of Systems Review of Systems  Constitutional: Negative for appetite change.  Respiratory: Positive for shortness of breath.   Cardiovascular: Positive for chest pain.  Gastrointestinal: Positive for abdominal pain. Negative for constipation and diarrhea.  Psychiatric/Behavioral: Positive for agitation, hallucinations and suicidal ideas. The patient is nervous/anxious.   All other systems reviewed and are negative.    Physical Exam Updated Vital Signs BP (!) 147/57 (BP Location: Left Arm)   Temp 98.3 F (36.8 C) (Oral)   Resp 20   Ht  (1.702 m)   LMP 09/07/2012   SpO2 100%   Physical Exam  Constitutional: She is oriented to person, place, and time. She appears well-developed and well-nourished. No distress.  HENT:  Head: Normocephalic and atraumatic.  Mouth/Throat: Oropharynx is clear and moist. Normal dentition. No  oropharyngeal exudate.  Poor dentition  Eyes: Conjunctivae and EOM are normal. Pupils are equal, round, and reactive to light.  Neck: Normal range of motion. Neck supple.  No meningismus.  Cardiovascular: Normal rate, regular rhythm, normal heart sounds and intact distal pulses.   No murmur heard. Pulmonary/Chest: Effort normal and breath sounds normal. No respiratory distress. She exhibits no tenderness.  Lungs CTA    Abdominal: Soft. There is no tenderness. There is no rebound and no guarding.  Musculoskeletal: Normal range of motion. She exhibits no edema or tenderness.  Neurological: She is alert and oriented to person, place, and time. No cranial nerve deficit. She exhibits normal muscle tone. Coordination normal.   5/5 strength throughout. CN 2-12 intact.Equal grip strength.   Skin: Skin is warm.  Psychiatric: Her behavior is normal. Her affect is labile.  Nursing note and vitals reviewed.   ED Treatments / Results  DIAGNOSTIC STUDIES: Oxygen Saturation is 100% on RA, normal by my interpretation.    COORDINATION OF CARE: 3:38 AM Discussed treatment plan with pt at bedside and pt agreed to plan. Will order EKG and labs.  Labs (all labs ordered are listed, but only abnormal results are displayed) Labs Reviewed  CBC WITH DIFFERENTIAL/PLATELET - Abnormal; Notable for the following:       Result Value   WBC 10.9 (*)    Hemoglobin 11.9 (*)    HCT 35.1 (*)    All other components within normal limits  COMPREHENSIVE METABOLIC PANEL - Abnormal; Notable for the following:    Glucose, Bld 104 (*)    ALT 13 (*)    All other components within normal limits  ACETAMINOPHEN LEVEL - Abnormal; Notable for the following:    Acetaminophen (Tylenol), Serum <10 (*)    All other components within normal limits  VALPROIC ACID LEVEL - Abnormal; Notable for the following:    Valproic Acid Lvl 45 (*)    All other components within normal limits  TROPONIN I  RAPID URINE DRUG SCREEN, HOSP PERFORMED  ETHANOL  SALICYLATE LEVEL  TROPONIN I    EKG  EKG Interpretation  Date/Time:  Tuesday January 11 2017 03:59:38 EDT Ventricular Rate:  60 PR Interval:    QRS Duration: 104 QT Interval:  426 QTC Calculation: 426 R Axis:   47 Text Interpretation:  Sinus rhythm Probable LVH with secondary repol abnrm Artifact Confirmed by Manus Gunning  MD, Darrick Greenlaw [phone]) on 01/11/2017 4:59:13 AM       Radiology Dg Chest 2  View  Result Date: 01/11/2017 CLINICAL DATA:  Sudden onset chest pain this morning EXAM: CHEST  2 VIEW COMPARISON:  09/07/2012 FINDINGS: The lungs are clear. The pulmonary vasculature is normal. Heart size is normal. Hilar and mediastinal contours are unremarkable. There is no pleural effusion. IMPRESSION: No active cardiopulmonary disease. Electronically Signed   By: Ellery Plunk M.D.   On: 01/11/2017 04:29    Procedures Procedures (including critical care time)  Medications Ordered in ED Medications - No data to display   Initial Impression / Assessment and Plan / ED Course  I have reviewed the triage vital signs and the nursing notes.  Pertinent labs & imaging results that were available during my care of the patient were reviewed by me and considered in my medical decision making (see chart for details).     Patient complains of suicidal ideation since yesterday. She denies any homicidal thoughts. She states she's been taking her medications as prescribed. Also endorses poorly described episodes of chest  pain since yesterday. Last cocaine use was 2 days ago. Chest pain lasts several minutes to hours at a time.  EKG is nonischemic. Drug screen negative for cocaine.  Labs are reassuring. Troponin is negative. EKG is nonischemic. We'll send second troponin 7 AM. She is medically clear for psychiatric evaluation. Patient states he more concerned about her friend who is also here for detox.  Holding orders Placed. She is medically clear for psychiatric evaluation.  Final Clinical Impressions(s) / ED Diagnoses   Final diagnoses:  Suicidal ideation  Cocaine abuse    New Prescriptions New Prescriptions   No medications on file   I personally performed the services described in this documentation, which was scribed in my presence. The recorded information has been reviewed and is accurate.    Danielle Octave, MD 01/11/17 [phone]

## 2017-01-11 NOTE — Progress Notes (Signed)
Per Karleen Hampshire, PA recommend a.m. Psych evaluation Tomeko Scoville K. Sherlon Handing, LPC-A, [facility]  Counselor 01/11/2017 6:21 AM

## 2017-01-11 NOTE — BH Assessment (Signed)
Tele Assessment Note   [name] is an 56 y.o. female, African American who presents to [facility] ED per ED report: Hx of schizophrenia who presents to the Emergency Department complaining of worsened recurrent suicidal ideations without plan x > 15 hours. She notes associated intermittent non radiating central chest pain since yesterday lasting 25-30 minutes at a time that is improved with breathing and is less mild than previous episodes of this pain; auditory hallucinations, stating she is hearing voices; homicidal ideation without noted plan and abdominal pain. Pt taking prescribed prolixin and Depakote and she states she took her last dosages of each last night. Pt "smoke[s] crack", and states she last used on 01/09/2017. Patient was last here at [facility] and not committed for inpatient release on 01/10/17.  Patient states that her primary concern is AH and SI with unspecified plan. Patient states that she still resides alone. Patients states she has had decrease in sleep with x 4 hours or less per night. Patient acknowledges current SI  With unspecified plan. Patient acknowledges HI, no plan or victim. Patient acknowledges current AH. Patient acknowledges past hx. Of inpatient at [facility] in 2018 for schizoaffective and S.A. Patient states that she is seen outpatient via [facility], acknowledges having ACCT team, but did not specify is for schizoaffective.  Patient is dressed in scrubs and is alert and oriented x4. Patient speech was within normal limits and motor behavior appeared normal. Patient thought process is coherent. Patient does not appear to be responding to internal stimuli. Patient was cooperative throughout the assessment and states that  she is agreeable to inpatient psychiatric treatment.   Diagnosis: Schizoaffective Disorder  Past Medical History:  Past Medical History:  Diagnosis Date  . Alcohol abuse   . Cocaine abuse   . Schizoaffective disorder, bipolar type  (HCC)   . Schizophrenia [facility])     Past Surgical History:  Procedure Laterality Date  . ABDOMINAL SURGERY      Family History: No family history on file.  Social History:  reports that she has been smoking Cigarettes.  She has been smoking about 0.00 packs per day. She has never used smokeless tobacco. She reports that she drinks alcohol. She reports that she uses drugs, including "Crack" cocaine and Cocaine.  Additional Social History:  Alcohol / Drug Use Pain Medications: SEE MAR Prescriptions: SEE MAR Over the Counter: SEE MAR History of alcohol / drug use?: Yes Longest period of sobriety (when/how long): unknown Negative Consequences of Use: Legal, Financial, Personal relationships, Work / School Withdrawal Symptoms: Patient aware of relationship between substance abuse and physical/medical complications Substance #1 Name of Substance 1: Crack Cocaine 1 - Age of First Use: unknown 1 - Amount (size/oz): various 1 - Frequency: "Whenever I can" 1 - Duration: years 1 - Last Use / Amount: 01/10/17 unknown amount Substance #2 Name of Substance 2: Alcohol 2 - Age of First Use: unknown 2 - Amount (size/oz): random 2 - Frequency: unspecified 2 - Duration: years 2 - Last Use / Amount: 01/10/17 unknown amount  CIWA: CIWA-Ar BP: (!) 147/57 COWS:    PATIENT STRENGTHS: (choose at least two) Active sense of humor Average or above average intelligence Capable of independent living  Allergies: No Known Allergies  Home Medications:  (Not in a hospital admission)  OB/GYN Status:  Patient's last menstrual period was 09/07/2012.  General Assessment Data Location of Assessment: [facility] TTS Assessment: In system Is this a Tele or Face-to-Face Assessment?: Face-to-Face Is this  an Initial Assessment or a Re-assessment for this encounter?: Initial Assessment Marital status: Single Maiden name: n/a Is patient pregnant?: No Pregnancy Status: No Living Arrangements: Alone Can pt return  to current living arrangement?: Yes Admission Status: Voluntary Is patient capable of signing voluntary admission?: Yes Referral Source: Self/Family/Friend Insurance type: Medicaid     Crisis Care Plan Living Arrangements: Alone Name of Psychiatrist: [facility] Name of Therapist: [facility]  Education Status Is patient currently in school?: No Current Grade: n/a Highest grade of school patient has completed: n/a Name of school: n/a Contact person: none given  Risk to self with the past 6 months Suicidal Ideation: Yes-Currently Present Has patient been a risk to self within the past 6 months prior to admission? : Yes Suicidal Intent: Yes-Currently Present Has patient had any suicidal intent within the past 6 months prior to admission? : Yes Is patient at risk for suicide?: Yes Suicidal Plan?: Yes-Currently Present Has patient had any suicidal plan within the past 6 months prior to admission? : Yes Specify Current Suicidal Plan: not specified Access to Means: Yes Specify Access to Suicidal Means: unknown What has been your use of drugs/alcohol within the last 12 months?: current Previous Attempts/Gestures: Yes How many times?: 5 Other Self Harm Risks: n/a Triggers for Past Attempts: None known Intentional Self Injurious Behavior: None Family Suicide History: No Recent stressful life event(s): Turmoil (Comment) Persecutory voices/beliefs?: No Depression: Yes Depression Symptoms: Despondent, Insomnia, Tearfulness, Isolating, Fatigue, Guilt, Loss of interest in usual pleasures, Feeling worthless/self pity Substance abuse history and/or treatment for substance abuse?: Yes Suicide prevention information given to non-admitted patients: Yes  Risk to Others within the past 6 months Homicidal Ideation: Yes-Currently Present Does patient have any lifetime risk of violence toward others beyond the six months prior to admission? : No Thoughts of Harm to  Others: Yes-Currently Present Comment - Thoughts of Harm to Others: no person identified Current Homicidal Intent: No Current Homicidal Plan: No Access to Homicidal Means: No Identified Victim: none History of harm to others?: No Assessment of Violence: None Noted Violent Behavior Description: n/a Does patient have access to weapons?: No Criminal Charges Pending?: No Does patient have a court date: No Is patient on probation?: No  Psychosis Hallucinations: Auditory Delusions: None noted  Mental Status Report Appearance/Hygiene: In scrubs Eye Contact: Poor Motor Activity: Freedom of movement Speech: Soft, Slow Level of Consciousness: Alert Mood: Depressed Affect: Depressed Anxiety Level: Panic Attacks Panic attack frequency: daily Most recent panic attack: 01/10/17 Thought Processes: Flight of Ideas Judgement: Impaired Orientation: Person, Place, Time, Situation, Appropriate for developmental age Obsessive Compulsive Thoughts/Behaviors: Minimal  Cognitive Functioning Concentration: Decreased Memory: Recent Intact, Remote Intact IQ: Average Insight: Poor Impulse Control: Poor Appetite: Fair Weight Loss: 0 Weight Gain: 0 Sleep: Decreased Total Hours of Sleep: 4 Vegetative Symptoms: None  ADLScreening [facility] Assessment Services) Patient's cognitive ability adequate to safely complete daily activities?: Yes Patient able to express need for assistance with ADLs?: Yes Independently performs ADLs?: Yes (appropriate for developmental age)  Prior Inpatient Therapy Prior Inpatient Therapy: Yes Prior Therapy Dates: 2018 Prior Therapy Facilty/Provider(s): [facility] Reason for Treatment: schizophrenia  Prior Outpatient Therapy Prior Outpatient Therapy: Yes Prior Therapy Dates: current Prior Therapy Facilty/Provider(s): [facility] Reason for Treatment: Schizoaffective Does patient have an ACCT team?: Yes Does patient have Intensive In-House Services?  :  No Does patient have [facility] services? : No Does patient have P4CC services?: No  ADL Screening (condition at time of admission) Patient's cognitive ability  adequate to safely complete daily activities?: Yes Is the patient deaf or have difficulty hearing?: No Does the patient have difficulty seeing, even when wearing glasses/contacts?: No Does the patient have difficulty concentrating, remembering, or making decisions?: No Patient able to express need for assistance with ADLs?: Yes Does the patient have difficulty dressing or bathing?: No Independently performs ADLs?: Yes (appropriate for developmental age) Does the patient have difficulty walking or climbing stairs?: No Weakness of Legs: None Weakness of Arms/Hands: None       Abuse/Neglect Assessment (Assessment to be complete while patient is alone) Physical Abuse: Denies Verbal Abuse: Denies Sexual Abuse: Denies Exploitation of patient/patient's resources: Denies Self-Neglect: Denies Values / Beliefs Cultural Requests During Hospitalization: None Spiritual Requests During Hospitalization: None   Advance Directives (For Healthcare) Does Patient Have a Medical Advance Directive?: No    Additional Information 1:1 In Past 12 Months?: No CIRT Risk: No Elopement Risk: No Does patient have medical clearance?: Yes     Disposition: Per Karleen Hampshire, PA recommend a.m. Psych evaluation Disposition Initial Assessment Completed for this Encounter: Yes Disposition of Patient: Other dispositions (TBD)  Gleason Ardoin K Primus Gritton 01/11/2017 6:12 AM

## 2017-01-17 ENCOUNTER — Encounter (HOSPITAL_COMMUNITY): Payer: Self-pay | Admitting: *Deleted

## 2017-01-17 ENCOUNTER — Emergency Department (HOSPITAL_COMMUNITY)
Admission: EM | Admit: 2017-01-17 | Discharge: 2017-01-18 | Disposition: A | Discharge status: Home / Self Care | Payer: Medicaid Other | Attending: Emergency Medicine | Admitting: Emergency Medicine

## 2017-01-17 DIAGNOSIS — Z79899 Other long term (current) drug therapy: Secondary | ICD-10-CM | POA: Diagnosis not present

## 2017-01-17 DIAGNOSIS — R45851 Suicidal ideations: Secondary | ICD-10-CM | POA: Diagnosis present

## 2017-01-17 DIAGNOSIS — G8929 Other chronic pain: Secondary | ICD-10-CM | POA: Insufficient documentation

## 2017-01-17 DIAGNOSIS — F22 Delusional disorders: Secondary | ICD-10-CM | POA: Insufficient documentation

## 2017-01-17 DIAGNOSIS — F17210 Nicotine dependence, cigarettes, uncomplicated: Secondary | ICD-10-CM | POA: Diagnosis not present

## 2017-01-17 DIAGNOSIS — R1030 Lower abdominal pain, unspecified: Secondary | ICD-10-CM | POA: Diagnosis not present

## 2017-01-17 DIAGNOSIS — F1414 Cocaine abuse with cocaine-induced mood disorder: Secondary | ICD-10-CM | POA: Diagnosis present

## 2017-01-17 DIAGNOSIS — R109 Unspecified abdominal pain: Secondary | ICD-10-CM

## 2017-01-17 DIAGNOSIS — R45850 Homicidal ideations: Secondary | ICD-10-CM | POA: Insufficient documentation
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
## 2017-01-17 NOTE — ED Notes (Signed)
Bed: OZ30 Expected date:  Expected time:  Means of arrival:  Comments: EMS 56 yo female schizophrenia

## 2017-01-17 NOTE — ED Provider Notes (Signed)
TIME SEEN: 12:00 AM 01/18/2017  CHIEF COMPLAINT: Suicidality  HPI: This is a 56 year old female with history of schizophrenia who presents to the ED with suicidality in the setting of cocaine use. The patient states that has wanted to commit suicide "for a while". She also states that she is paranoid and thinks "everyone" is out to get her. She used cocaine several hours prior to arrival. Reported homicidal thoughts to nursing. When asked, she is otherwise reporting lower abdominal pain, diarrhea, and vomiting "for a while, over a year". Of note, the patient has been seen two previous times this week at this facility with similar presentations.   ROS: See HPI Constitutional: no fever  Eyes: no drainage  ENT: no runny nose   Cardiovascular:  no chest pain  Resp: no SOB  GI: + abdominal pain, vomiting, diarrhea per HPI GU: no dysuria Integumentary: no rash  Allergy: no hives  Musculoskeletal: no leg swelling  Neurological: no slurred speech Psych: + SI, HI. No hallucinations.  ROS otherwise negative  PAST MEDICAL HISTORY/PAST SURGICAL HISTORY:  Past Medical History:  Diagnosis Date  . Alcohol abuse   . Cocaine abuse   . Schizoaffective disorder, bipolar type (HCC)   . Schizophrenia (HCC)     MEDICATIONS:  Prior to Admission medications   Medication Sig Start Date End Date Taking? Authorizing Provider  haloperidol decanoate (HALDOL DECANOATE) 100 MG/ML injection Inject 100 mg into the muscle every 28 (twenty-eight) days.    Historical Provider, MD    ALLERGIES:  No Known Allergies  SOCIAL HISTORY:  Social History  Substance Use Topics  . Smoking status: Current Every Day Smoker    Packs/day: 0.00    Types: Cigarettes  . Smokeless tobacco: Never Used  . Alcohol use Yes     Comment: patient drinks beer and wine daily    FAMILY HISTORY: No family history on file.  EXAM: BP 124/87 (BP Location: Right Arm)   Pulse 64   Temp 98.2 F (36.8 C) (Oral)   Resp 16   Ht 5'  7" (1.702 m)   LMP 09/07/2012   SpO2 100%  CONSTITUTIONAL: Alert and oriented and responds appropriately to questions. Chronically ill-appearing. HEAD: Normocephalic EYES: Conjunctivae clear, pupils appear equal, EOMI ENT: normal nose; moist mucous membranes NECK: Supple, no meningismus, no nuchal rigidity, no LAD  CARD: RRR; S1 and S2 appreciated; no murmurs, no clicks, no rubs, no gallops RESP: Normal chest excursion without splinting or tachypnea; breath sounds clear and equal bilaterally; no wheezes, no rhonchi, no rales, no hypoxia or respiratory distress, speaking full sentences ABD/GI: Normal bowel sounds; non-distended; soft, non-tender, no rebound, no guarding, no peritoneal signs, no hepatosplenomegaly BACK:  The back appears normal and is non-tender to palpation, there is no CVA tenderness EXT: Normal ROM in all joints; non-tender to palpation; no edema; normal capillary refill; no cyanosis, no calf tenderness or swelling    SKIN: Normal color for age and race; warm; no rash NEURO: Moves all extremities equally PSYCH: Suicidal; homicidal; paranoid.   MEDICAL DECISION MAKING: Patient here with SI, HI, paranoia. Has history of schizophrenia. History of cocaine and alcohol abuse as well. Complaining of chronic abdominal pain. We'll obtain screening labs, urine. Abdominal exam is benign. I do not feel she needs emergent abdominal imaging. We'll consult TTS for further evaluation given she is unable to contract for safety at this time.  ED PROGRESS: 2 AM  Pt's labs are unremarkable. Urine and drug screen pending. Otherwise medically  cleared. Awaiting TTS evaluation.  I reviewed all nursing notes, vitals, pertinent previous records, EKGs, lab and urine results, imaging (as available).     I personally performed the services described in this documentation, which was scribed in my presence. The recorded information has been reviewed and is accurate.    Layla Maw Hulda Reddix, DO 01/18/17  0200

## 2017-01-18 LAB — CBC WITH DIFFERENTIAL/PLATELET
BASOS ABS: 0 10*3/uL (ref 0.0–0.1)
Basophils Relative: 0 %
Eosinophils Absolute: 0.1 10*3/uL (ref 0.0–0.7)
Eosinophils Relative: 1 %
HEMATOCRIT: 35.6 % — AB (ref 36.0–46.0)
HEMOGLOBIN: 11.8 g/dL — AB (ref 12.0–15.0)
LYMPHS PCT: 33 %
Lymphs Abs: 2.8 10*3/uL (ref 0.7–4.0)
MCH: 30.1 pg (ref 26.0–34.0)
MCHC: 33.1 g/dL (ref 30.0–36.0)
MCV: 90.8 fL (ref 78.0–100.0)
MONO ABS: 0.6 10*3/uL (ref 0.1–1.0)
Monocytes Relative: 8 %
NEUTROS ABS: 5 10*3/uL (ref 1.7–7.7)
NEUTROS PCT: 58 %
Platelets: 320 10*3/uL (ref 150–400)
RBC: 3.92 MIL/uL (ref 3.87–5.11)
RDW: 14.1 % (ref 11.5–15.5)
WBC: 8.6 10*3/uL (ref 4.0–10.5)

## 2017-01-18 LAB — RAPID URINE DRUG SCREEN, HOSP PERFORMED
AMPHETAMINES: NOT DETECTED
Barbiturates: NOT DETECTED
Benzodiazepines: NOT DETECTED
COCAINE: POSITIVE — AB
OPIATES: NOT DETECTED
TETRAHYDROCANNABINOL: NOT DETECTED

## 2017-01-18 LAB — COMPREHENSIVE METABOLIC PANEL
ALBUMIN: 4 g/dL (ref 3.5–5.0)
ALK PHOS: 59 U/L (ref 38–126)
ALT: 11 U/L — AB (ref 14–54)
AST: 15 U/L (ref 15–41)
Anion gap: 6 (ref 5–15)
BILIRUBIN TOTAL: 0.3 mg/dL (ref 0.3–1.2)
BUN: 10 mg/dL (ref 6–20)
CALCIUM: 9.2 mg/dL (ref 8.9–10.3)
CO2: 30 mmol/L (ref 22–32)
CREATININE: 0.68 mg/dL (ref 0.44–1.00)
Chloride: 106 mmol/L (ref 101–111)
GFR calc Af Amer: >60 mL/min (ref >60)
GLUCOSE: 110 mg/dL — AB (ref 65–99)
Potassium: 3.7 mmol/L (ref 3.5–5.1)
Sodium: 142 mmol/L (ref 135–145)
Total Protein: 7.7 g/dL (ref 6.5–8.1)

## 2017-01-18 LAB — SALICYLATE LEVEL: Salicylate Lvl: <7 mg/dL (ref 2.8–30.0)

## 2017-01-18 LAB — URINALYSIS, ROUTINE W REFLEX MICROSCOPIC
BILIRUBIN URINE: NEGATIVE
Glucose, UA: NEGATIVE mg/dL
Hgb urine dipstick: NEGATIVE
Ketones, ur: NEGATIVE mg/dL
Nitrite: NEGATIVE
PH: 5 (ref 5.0–8.0)
Protein, ur: NEGATIVE mg/dL
SPECIFIC GRAVITY, URINE: 1.024 (ref 1.005–1.030)

## 2017-01-18 LAB — ACETAMINOPHEN LEVEL: Acetaminophen (Tylenol), Serum: <10 ug/mL — ABNORMAL LOW (ref 10–30)

## 2017-01-18 LAB — LIPASE, BLOOD: LIPASE: 30 U/L (ref 11–51)

## 2017-01-18 LAB — ETHANOL

## 2017-01-18 NOTE — ED Notes (Signed)
Patient given her clothes and told she was discharged.  Bus pass offered.  Patient said she didn't want to be discharged.  Asked to use the telephone.
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
## 2017-01-18 NOTE — ED Notes (Signed)
Patient waiting for ride from her ACT team.  If they do not arrive by 1:00 patient to be sent home with a bus ticket.

## 2017-01-18 NOTE — BH Assessment (Signed)
[facility] Assessment Progress Note  Per Thedore Mins, MD, this pt does not require psychiatric hospitalization at this time.  Pt is to be discharged from [facility] with recommendation to continue treatment with the [facility], her current outpatient provider.  This has been included in pt's discharge instructions.  Pt's nurse, Aram Beecham, has been notified.  Pt has asked for her ACT Team to be called to pick her up.  At 10:35 this Clinical research associate called [facility] and spoke to [place], informing her of pt request, and also notifying her that pt will be discharged by 13:00.  Doylene Canning, MA Triage Specialist [phone]

## 2017-01-18 NOTE — [facility] Suicide Risk Assessment (Signed)
Suicide Risk Assessment  Discharge Assessment   [facility] Discharge Suicide Risk Assessment   Principal Problem: Cocaine abuse with cocaine-induced mood disorder [facility]) Discharge Diagnoses:  Patient Active Problem List   Diagnosis Date Noted  . Cocaine abuse with cocaine-induced mood disorder [facility]) [F14.14] 12/08/2015    Priority: High  . Schizophrenia (HCC) [F20.9] 01/26/2014    Priority: High  . Polysubstance abuse [F19.10] 01/26/2014    Priority: High  . Substance induced mood disorder (HCC) [F19.94] 12/29/2014  . Hallucination [R44.3]   . Homicidal ideation [R45.850]   . Suicidal ideation [R45.851]     Total Time spent with patient: 45 minutes  Musculoskeletal: Strength & Muscle Tone: within normal limits Gait & Station: normal Patient leans: N/A  Psychiatric Specialty Exam:   Blood pressure 124/60, pulse 60, temperature 98.2 F (36.8 C), temperature source Oral, resp. rate 16, height  (1.702 m), last menstrual period 09/07/2012, SpO2 100 %.There is no height or weight on file to calculate BMI.  General Appearance: Casual  Eye Contact::  Good  Speech:  Normal Rate409  Volume:  Increased  Mood:  Euthymic  Affect:  Congruent  Thought Process:  Coherent and Descriptions of Associations: Intact  Orientation:  Full (Time, Place, and Person)  Thought Content:  WDL and Logical  Suicidal Thoughts:  No  Homicidal Thoughts:  No  Memory:  Immediate;   Good Recent;   Good Remote;   Good  Judgement:  Fair  Insight:  Fair  Psychomotor Activity:  Normal  Concentration:  Good  Recall:  Good  Fund of Knowledge:Fair  Language: Good  Akathisia:  No  Handed:  Right  AIMS (if indicated):     Assets:  Housing Leisure Time Physical Health Resilience  Sleep:     Cognition: WNL  ADL's:  Intact   Mental Status Per Nursing Assessment::   On Admission:   cocaine abuse and upset her family keeps trying to take her food stamps, no suicidal/homicidal ideations, hallucinations, or  withdrawal symptoms.  Demographic Factors:  NA  Loss Factors: NA  Historical Factors: NA  Risk Reduction Factors:   Sense of responsibility to family, Positive social support and Positive therapeutic relationship  Continued Clinical Symptoms:  None  Cognitive Features That Contribute To Risk:  None    Suicide Risk:  Minimal: No identifiable suicidal ideation.  Patients presenting with no risk factors but with morbid ruminations; may be classified as minimal risk based on the severity of the depressive symptoms    Plan Of Care/Follow-up recommendations:  Activity:  as tolerated Diet:  heart healthy diet  LORD, JAMISON, NP 01/18/2017, 10:18 AM

## 2017-01-18 NOTE — ED Notes (Signed)
TTS assessment in progress. 

## 2017-01-18 NOTE — Discharge Instructions (Signed)
For your ongoing behavioral health needs, you are advised to continue treatment with [facility], your current outpatient provider:       [facility]      [address]      [place], [place] [address]      [phone]

## 2017-01-18 NOTE — ED Notes (Signed)
Security called to get patient to leave.  Patient would not get dressed after nurse talked to her twice about being discharged.  Offered patient a bus pass again.  Security at bedside encouraging patient to get dressed and leave.

## 2017-01-19 LAB — URINE CULTURE

## 2017-02-19 ENCOUNTER — Emergency Department (HOSPITAL_COMMUNITY): Payer: Medicaid Other

## 2017-02-19 ENCOUNTER — Inpatient Hospital Stay (HOSPITAL_COMMUNITY)
Admission: EM | Admit: 2017-02-19 | Discharge: 2017-03-11 | DRG: 957 | Disposition: E | Payer: Medicaid Other | Attending: General Surgery | Admitting: General Surgery

## 2017-02-19 ENCOUNTER — Emergency Department (HOSPITAL_COMMUNITY)
Admission: EM | Admit: 2017-02-19 | Discharge: 2017-02-19 | Disposition: A | Discharge status: Home / Self Care | Payer: Medicaid Other | Attending: Dermatology | Admitting: Dermatology

## 2017-02-19 ENCOUNTER — Encounter (HOSPITAL_COMMUNITY): Payer: Self-pay | Admitting: Radiology

## 2017-02-19 ENCOUNTER — Emergency Department (HOSPITAL_COMMUNITY): Payer: Medicaid Other | Admitting: Certified Registered Nurse Anesthetist

## 2017-02-19 ENCOUNTER — Inpatient Hospital Stay (HOSPITAL_COMMUNITY): Payer: Medicaid Other

## 2017-02-19 ENCOUNTER — Encounter (HOSPITAL_COMMUNITY): Payer: Self-pay

## 2017-02-19 ENCOUNTER — Encounter (HOSPITAL_COMMUNITY): Admission: EM | Disposition: E | Payer: Self-pay | Source: Home / Self Care

## 2017-02-19 DIAGNOSIS — F1910 Other psychoactive substance abuse, uncomplicated: Secondary | ICD-10-CM | POA: Diagnosis present

## 2017-02-19 DIAGNOSIS — J9690 Respiratory failure, unspecified, unspecified whether with hypoxia or hypercapnia: Secondary | ICD-10-CM | POA: Diagnosis present

## 2017-02-19 DIAGNOSIS — Z5321 Procedure and treatment not carried out due to patient leaving prior to being seen by health care provider: Secondary | ICD-10-CM | POA: Diagnosis not present

## 2017-02-19 DIAGNOSIS — S37031A Laceration of right kidney, unspecified degree, initial encounter: Secondary | ICD-10-CM | POA: Diagnosis present

## 2017-02-19 DIAGNOSIS — S32402A Unspecified fracture of left acetabulum, initial encounter for closed fracture: Secondary | ICD-10-CM | POA: Diagnosis present

## 2017-02-19 DIAGNOSIS — S92002B Unspecified fracture of left calcaneus, initial encounter for open fracture: Secondary | ICD-10-CM | POA: Diagnosis present

## 2017-02-19 DIAGNOSIS — Z419 Encounter for procedure for purposes other than remedying health state, unspecified: Secondary | ICD-10-CM

## 2017-02-19 DIAGNOSIS — S32110A Nondisplaced Zone I fracture of sacrum, initial encounter for closed fracture: Secondary | ICD-10-CM | POA: Diagnosis present

## 2017-02-19 DIAGNOSIS — E872 Acidosis: Secondary | ICD-10-CM | POA: Diagnosis present

## 2017-02-19 DIAGNOSIS — S32058A Other fracture of fifth lumbar vertebra, initial encounter for closed fracture: Secondary | ICD-10-CM | POA: Diagnosis present

## 2017-02-19 DIAGNOSIS — R109 Unspecified abdominal pain: Secondary | ICD-10-CM | POA: Diagnosis present

## 2017-02-19 DIAGNOSIS — S31123A Laceration of abdominal wall with foreign body, right lower quadrant without penetration into peritoneal cavity, initial encounter: Secondary | ICD-10-CM | POA: Diagnosis present

## 2017-02-19 DIAGNOSIS — R4020 Unspecified coma: Secondary | ICD-10-CM | POA: Diagnosis present

## 2017-02-19 DIAGNOSIS — D62 Acute posthemorrhagic anemia: Secondary | ICD-10-CM | POA: Diagnosis present

## 2017-02-19 DIAGNOSIS — S42461B Displaced fracture of medial condyle of right humerus, initial encounter for open fracture: Secondary | ICD-10-CM | POA: Diagnosis present

## 2017-02-19 DIAGNOSIS — S36116A Major laceration of liver, initial encounter: Secondary | ICD-10-CM | POA: Diagnosis present

## 2017-02-19 DIAGNOSIS — S82892C Other fracture of left lower leg, initial encounter for open fracture type IIIA, IIIB, or IIIC: Secondary | ICD-10-CM

## 2017-02-19 DIAGNOSIS — R578 Other shock: Secondary | ICD-10-CM | POA: Diagnosis present

## 2017-02-19 DIAGNOSIS — S82852C Displaced trimalleolar fracture of left lower leg, initial encounter for open fracture type IIIA, IIIB, or IIIC: Secondary | ICD-10-CM | POA: Diagnosis present

## 2017-02-19 DIAGNOSIS — S299XXA Unspecified injury of thorax, initial encounter: Secondary | ICD-10-CM

## 2017-02-19 DIAGNOSIS — I491 Atrial premature depolarization: Secondary | ICD-10-CM | POA: Diagnosis present

## 2017-02-19 DIAGNOSIS — S22018A Other fracture of first thoracic vertebra, initial encounter for closed fracture: Secondary | ICD-10-CM | POA: Diagnosis present

## 2017-02-19 DIAGNOSIS — Z978 Presence of other specified devices: Secondary | ICD-10-CM

## 2017-02-19 DIAGNOSIS — S82872B Displaced pilon fracture of left tibia, initial encounter for open fracture type I or II: Secondary | ICD-10-CM | POA: Diagnosis present

## 2017-02-19 DIAGNOSIS — S82422A Displaced transverse fracture of shaft of left fibula, initial encounter for closed fracture: Secondary | ICD-10-CM | POA: Diagnosis present

## 2017-02-19 DIAGNOSIS — N170 Acute kidney failure with tubular necrosis: Secondary | ICD-10-CM | POA: Diagnosis not present

## 2017-02-19 DIAGNOSIS — D689 Coagulation defect, unspecified: Secondary | ICD-10-CM | POA: Diagnosis not present

## 2017-02-19 DIAGNOSIS — S271XXA Traumatic hemothorax, initial encounter: Secondary | ICD-10-CM | POA: Diagnosis present

## 2017-02-19 DIAGNOSIS — S42111B Displaced fracture of body of scapula, right shoulder, initial encounter for open fracture: Secondary | ICD-10-CM | POA: Diagnosis present

## 2017-02-19 DIAGNOSIS — S32028A Other fracture of second lumbar vertebra, initial encounter for closed fracture: Secondary | ICD-10-CM | POA: Diagnosis present

## 2017-02-19 DIAGNOSIS — K7290 Hepatic failure, unspecified without coma: Secondary | ICD-10-CM | POA: Diagnosis present

## 2017-02-19 DIAGNOSIS — Z66 Do not resuscitate: Secondary | ICD-10-CM | POA: Diagnosis not present

## 2017-02-19 DIAGNOSIS — S32048A Other fracture of fourth lumbar vertebra, initial encounter for closed fracture: Secondary | ICD-10-CM | POA: Diagnosis present

## 2017-02-19 DIAGNOSIS — S2243XA Multiple fractures of ribs, bilateral, initial encounter for closed fracture: Secondary | ICD-10-CM | POA: Diagnosis present

## 2017-02-19 DIAGNOSIS — T148XXA Other injury of unspecified body region, initial encounter: Secondary | ICD-10-CM | POA: Diagnosis present

## 2017-02-19 DIAGNOSIS — Z23 Encounter for immunization: Secondary | ICD-10-CM

## 2017-02-19 DIAGNOSIS — K661 Hemoperitoneum: Secondary | ICD-10-CM | POA: Diagnosis present

## 2017-02-19 DIAGNOSIS — S32018A Other fracture of first lumbar vertebra, initial encounter for closed fracture: Secondary | ICD-10-CM | POA: Diagnosis present

## 2017-02-19 DIAGNOSIS — F17200 Nicotine dependence, unspecified, uncomplicated: Secondary | ICD-10-CM | POA: Diagnosis present

## 2017-02-19 DIAGNOSIS — J398 Other specified diseases of upper respiratory tract: Secondary | ICD-10-CM

## 2017-02-19 DIAGNOSIS — R58 Hemorrhage, not elsewhere classified: Secondary | ICD-10-CM | POA: Diagnosis present

## 2017-02-19 DIAGNOSIS — S32301A Unspecified fracture of right ilium, initial encounter for closed fracture: Secondary | ICD-10-CM | POA: Diagnosis present

## 2017-02-19 DIAGNOSIS — T1490XA Injury, unspecified, initial encounter: Secondary | ICD-10-CM | POA: Diagnosis present

## 2017-02-19 DIAGNOSIS — E875 Hyperkalemia: Secondary | ICD-10-CM | POA: Diagnosis present

## 2017-02-19 DIAGNOSIS — F259 Schizoaffective disorder, unspecified: Secondary | ICD-10-CM | POA: Diagnosis present

## 2017-02-19 DIAGNOSIS — S32038A Other fracture of third lumbar vertebra, initial encounter for closed fracture: Secondary | ICD-10-CM | POA: Diagnosis present

## 2017-02-19 DIAGNOSIS — R34 Anuria and oliguria: Secondary | ICD-10-CM | POA: Diagnosis present

## 2017-02-19 DIAGNOSIS — S31812A Laceration with foreign body of right buttock, initial encounter: Secondary | ICD-10-CM | POA: Diagnosis present

## 2017-02-19 DIAGNOSIS — S52121A Displaced fracture of head of right radius, initial encounter for closed fracture: Secondary | ICD-10-CM | POA: Diagnosis present

## 2017-02-19 DIAGNOSIS — D696 Thrombocytopenia, unspecified: Secondary | ICD-10-CM | POA: Diagnosis present

## 2017-02-19 DIAGNOSIS — S32810A Multiple fractures of pelvis with stable disruption of pelvic ring, initial encounter for closed fracture: Secondary | ICD-10-CM

## 2017-02-19 HISTORY — PX: INCISION AND DRAINAGE OF WOUND: SHX1803

## 2017-02-19 HISTORY — PX: LAPAROTOMY: SHX154

## 2017-02-19 HISTORY — DX: Hallucinations, unspecified: R44.3

## 2017-02-19 HISTORY — DX: Other psychoactive substance abuse, uncomplicated: F19.10

## 2017-02-19 HISTORY — DX: Schizoaffective disorder, unspecified: F25.9

## 2017-02-19 HISTORY — PX: CHEST TUBE INSERTION: SHX231

## 2017-02-19 LAB — DIC (DISSEMINATED INTRAVASCULAR COAGULATION)PANEL
D-Dimer, Quant: 14.19 ug/mL-FEU — ABNORMAL HIGH (ref 0.00–0.50)
Fibrinogen: 149 mg/dL — ABNORMAL LOW (ref 210–475)
INR: 1.78
Platelets: 39 10*3/uL — ABNORMAL LOW (ref 150–400)
Prothrombin Time: 20.9 seconds — ABNORMAL HIGH (ref 11.4–15.2)
Smear Review: NONE SEEN

## 2017-02-19 LAB — I-STAT CHEM 8, ED
BUN: 14 mg/dL (ref 6–20)
CREATININE: 1 mg/dL (ref 0.44–1.00)
Calcium, Ion: 0.86 mmol/L — CL (ref 1.15–1.40)
Chloride: 102 mmol/L (ref 101–111)
GLUCOSE: 119 mg/dL — AB (ref 65–99)
HEMATOCRIT: 30 % — AB (ref 36.0–46.0)
Hemoglobin: 10.2 g/dL — ABNORMAL LOW (ref 12.0–15.0)
POTASSIUM: 6 mmol/L — AB (ref 3.5–5.1)
Sodium: 130 mmol/L — ABNORMAL LOW (ref 135–145)
TCO2: 18 mmol/L (ref 0–100)

## 2017-02-19 LAB — COMPREHENSIVE METABOLIC PANEL
ALT: 120 U/L — AB (ref 14–54)
AST: 208 U/L — AB (ref 15–41)
Albumin: 2.4 g/dL — ABNORMAL LOW (ref 3.5–5.0)
Alkaline Phosphatase: 41 U/L (ref 38–126)
Anion gap: 11 (ref 5–15)
BILIRUBIN TOTAL: 1.4 mg/dL — AB (ref 0.3–1.2)
BUN: 11 mg/dL (ref 6–20)
CHLORIDE: 99 mmol/L — AB (ref 101–111)
CO2: 14 mmol/L — AB (ref 22–32)
Calcium: 6.8 mg/dL — ABNORMAL LOW (ref 8.9–10.3)
Creatinine, Ser: 0.91 mg/dL (ref 0.44–1.00)
GLUCOSE: 128 mg/dL — AB (ref 65–99)
Potassium: 5.8 mmol/L — ABNORMAL HIGH (ref 3.5–5.1)
Sodium: 124 mmol/L — ABNORMAL LOW (ref 135–145)
Total Protein: 4.2 g/dL — ABNORMAL LOW (ref 6.5–8.1)

## 2017-02-19 LAB — CBC
HEMATOCRIT: 25.5 % — AB (ref 36.0–46.0)
Hemoglobin: 8.1 g/dL — ABNORMAL LOW (ref 12.0–15.0)
MCH: 30.8 pg (ref 26.0–34.0)
MCHC: 31.8 g/dL (ref 30.0–36.0)
MCV: 97 fL (ref 78.0–100.0)
Platelets: 154 10*3/uL (ref 150–400)
RBC: 2.63 MIL/uL — ABNORMAL LOW (ref 3.87–5.11)
RDW: 14.7 % (ref 11.5–15.5)
WBC: 12.9 10*3/uL — ABNORMAL HIGH (ref 4.0–10.5)

## 2017-02-19 LAB — I-STAT ARTERIAL BLOOD GAS, ED
Acid-base deficit: 14 mmol/L — ABNORMAL HIGH (ref 0.0–2.0)
Bicarbonate: 14.3 mmol/L — ABNORMAL LOW (ref 20.0–28.0)
O2 SAT: 100 %
PCO2 ART: 47.1 mmHg (ref 32.0–48.0)
PO2 ART: 341 mmHg — AB (ref 83.0–108.0)
Patient temperature: 98.6
TCO2: 16 mmol/L (ref 0–100)
pH, Arterial: 7.09 — CL (ref 7.350–7.450)

## 2017-02-19 LAB — PROTIME-INR
INR: 1.5
Prothrombin Time: 18.3 seconds — ABNORMAL HIGH (ref 11.4–15.2)

## 2017-02-19 LAB — CDS SEROLOGY

## 2017-02-19 LAB — DIC (DISSEMINATED INTRAVASCULAR COAGULATION) PANEL: APTT: 59 s — AB (ref 24–36)

## 2017-02-19 LAB — I-STAT CG4 LACTIC ACID, ED: Lactic Acid, Venous: 4.8 mmol/L (ref 0.5–1.9)

## 2017-02-19 LAB — MASSIVE TRANSFUSION PROTOCOL ORDER (BLOOD BANK NOTIFICATION)

## 2017-02-19 LAB — ETHANOL: Alcohol, Ethyl (B): <5 mg/dL (ref <5)

## 2017-02-19 SURGERY — LAPAROTOMY, EXPLORATORY
Anesthesia: General | Site: Chest

## 2017-02-19 MED ORDER — ETOMIDATE 2 MG/ML IV SOLN
INTRAVENOUS | Status: AC | PRN
  Administered 2017-02-19: 20 mg via INTRAVENOUS

## 2017-02-19 MED ORDER — PANTOPRAZOLE SODIUM 40 MG PO TBEC: 40.0000 mg | DELAYED_RELEASE_TABLET | Freq: Every day | ORAL | Status: DC

## 2017-02-19 MED ORDER — SODIUM CHLORIDE 0.9 % IV SOLN: INTRAVENOUS | Status: DC | PRN

## 2017-02-19 MED ORDER — ONDANSETRON HCL 4 MG PO TABS: 4.0000 mg | ORAL_TABLET | Freq: Four times a day (QID) | ORAL | Status: DC | PRN

## 2017-02-19 MED ORDER — SODIUM CHLORIDE 0.9 % IV SOLN: Freq: Once | INTRAVENOUS | Status: DC

## 2017-02-19 MED ORDER — CEFAZOLIN SODIUM-DEXTROSE 2-4 GM/100ML-% IV SOLN
2.0000 g | Freq: Three times a day (TID) | INTRAVENOUS | Status: DC
  Administered 2017-02-20 – 2017-02-21 (×4): 2 g via INTRAVENOUS
  Filled 2017-02-19 (×5): qty 100

## 2017-02-19 MED ORDER — SODIUM CHLORIDE 0.9 % IV SOLN
INTRAVENOUS | Status: DC | PRN
  Administered 2017-02-19 (×2): via INTRAVENOUS

## 2017-02-19 MED ORDER — CALCIUM CHLORIDE 10 % IV SOLN
INTRAVENOUS | Status: DC | PRN
  Administered 2017-02-19: 100 mg via INTRAVENOUS
  Administered 2017-02-19: 500 mg via INTRAVENOUS
  Administered 2017-02-19: 100 mg via INTRAVENOUS

## 2017-02-19 MED ORDER — TETANUS-DIPHTH-ACELL PERTUSSIS 5-2.5-18.5 LF-MCG/0.5 IM SUSP
0.5000 mL | Freq: Once | INTRAMUSCULAR | Status: AC
  Administered 2017-02-20: 0.5 mL via INTRAMUSCULAR
  Filled 2017-02-19: qty 0.5

## 2017-02-19 MED ORDER — FENTANYL 2500MCG IN NS 250ML (10MCG/ML) PREMIX INFUSION
25.0000 ug/h | INTRAVENOUS | Status: DC
  Administered 2017-02-19: 50 ug/h via INTRAVENOUS
  Filled 2017-02-19: qty 250

## 2017-02-19 MED ORDER — FENTANYL CITRATE (PF) 100 MCG/2ML IJ SOLN
INTRAMUSCULAR | Status: DC | PRN
  Administered 2017-02-19 (×2): 100 ug via INTRAVENOUS
  Administered 2017-02-19: 50 ug via INTRAVENOUS

## 2017-02-19 MED ORDER — MIDAZOLAM HCL 2 MG/2ML IJ SOLN: 2.0000 mg | INTRAMUSCULAR | Status: DC | PRN

## 2017-02-19 MED ORDER — SODIUM CHLORIDE 0.9 % IV SOLN
0.0000 ug/min | INTRAVENOUS | Status: DC
  Administered 2017-02-19: 53.33 ug/min via INTRAVENOUS
  Administered 2017-02-20: 120 ug/min via INTRAVENOUS
  Administered 2017-02-20: 20 ug/min via INTRAVENOUS
  Administered 2017-02-20: 40 ug/min via INTRAVENOUS
  Filled 2017-02-19 (×4): qty 1

## 2017-02-19 MED ORDER — SUCCINYLCHOLINE CHLORIDE 20 MG/ML IJ SOLN
INTRAMUSCULAR | Status: AC | PRN
  Administered 2017-02-19: 100 mg via INTRAVENOUS

## 2017-02-19 MED ORDER — FENTANYL CITRATE (PF) 100 MCG/2ML IJ SOLN: 50.0000 ug | Freq: Once | INTRAMUSCULAR | Status: DC

## 2017-02-19 MED ORDER — MIDAZOLAM HCL 2 MG/2ML IJ SOLN
INTRAMUSCULAR | Status: AC
  Filled 2017-02-19: qty 2

## 2017-02-19 MED ORDER — SODIUM BICARBONATE 8.4 % IV SOLN
INTRAVENOUS | Status: DC | PRN
  Administered 2017-02-19: 50 meq via INTRAVENOUS
  Administered 2017-02-19: 100 meq via INTRAVENOUS
  Administered 2017-02-19: 50 meq via INTRAVENOUS

## 2017-02-19 MED ORDER — ONDANSETRON 4 MG PO TBDP: 4.0000 mg | ORAL_TABLET | Freq: Once | ORAL | Status: DC | PRN

## 2017-02-19 MED ORDER — ORAL CARE MOUTH RINSE
15.0000 mL | OROMUCOSAL | Status: DC
  Administered 2017-02-20 – 2017-02-21 (×16): 15 mL via OROMUCOSAL

## 2017-02-19 MED ORDER — MIDAZOLAM HCL 2 MG/2ML IJ SOLN
INTRAMUSCULAR | Status: DC | PRN
  Administered 2017-02-19: 2 mg via INTRAVENOUS

## 2017-02-19 MED ORDER — LACTATED RINGERS IV SOLN
INTRAVENOUS | Status: DC | PRN
  Administered 2017-02-19 (×4): via INTRAVENOUS

## 2017-02-19 MED ORDER — ROCURONIUM BROMIDE 50 MG/5ML IV SOLN
INTRAVENOUS | Status: AC | PRN
  Administered 2017-02-19: 75 mg via INTRAVENOUS

## 2017-02-19 MED ORDER — CHLORHEXIDINE GLUCONATE 0.12% ORAL RINSE (MEDLINE KIT)
15.0000 mL | Freq: Two times a day (BID) | OROMUCOSAL | Status: DC
  Administered 2017-02-20 – 2017-02-21 (×4): 15 mL via OROMUCOSAL

## 2017-02-19 MED ORDER — ONDANSETRON HCL 4 MG/2ML IJ SOLN: 4.0000 mg | Freq: Four times a day (QID) | INTRAMUSCULAR | Status: DC | PRN

## 2017-02-19 MED ORDER — FENTANYL CITRATE (PF) 250 MCG/5ML IJ SOLN
INTRAMUSCULAR | Status: AC
  Filled 2017-02-19: qty 5

## 2017-02-19 MED ORDER — NOREPINEPHRINE BITARTRATE 1 MG/ML IV SOLN
0.0000 ug/min | INTRAVENOUS | Status: DC
  Administered 2017-02-20: 4 ug/min via INTRAVENOUS
  Filled 2017-02-19 (×2): qty 4

## 2017-02-19 MED ORDER — 0.9 % SODIUM CHLORIDE (POUR BTL) OPTIME
TOPICAL | Status: DC | PRN
  Administered 2017-02-19: 4000 mL

## 2017-02-19 MED ORDER — CEFAZOLIN SODIUM-DEXTROSE 1-4 GM/50ML-% IV SOLN
1.0000 g | Freq: Three times a day (TID) | INTRAVENOUS | Status: DC
  Administered 2017-02-19: 2 g via INTRAVENOUS
  Filled 2017-02-19 (×2): qty 50

## 2017-02-19 MED ORDER — DOCUSATE SODIUM 50 MG/5ML PO LIQD: 100.0000 mg | Freq: Two times a day (BID) | ORAL | Status: DC | PRN

## 2017-02-19 MED ORDER — SODIUM CHLORIDE 0.9 % IV SOLN
INTRAVENOUS | Status: AC | PRN
  Administered 2017-02-19 (×2): 1000 mL via INTRAVENOUS

## 2017-02-19 MED ORDER — ROCURONIUM BROMIDE 100 MG/10ML IV SOLN
INTRAVENOUS | Status: DC | PRN
  Administered 2017-02-19 (×2): 50 mg via INTRAVENOUS

## 2017-02-19 MED ORDER — FENTANYL 2500MCG IN NS 250ML (10MCG/ML) PREMIX INFUSION: 25.0000 ug/h | INTRAVENOUS | Status: DC

## 2017-02-19 MED ORDER — FENTANYL BOLUS VIA INFUSION
50.0000 ug | INTRAVENOUS | Status: DC | PRN
  Filled 2017-02-19: qty 50

## 2017-02-19 MED ORDER — MIDAZOLAM HCL 5 MG/5ML IJ SOLN
INTRAMUSCULAR | Status: AC | PRN
  Administered 2017-02-19: 4 mg via INTRAVENOUS
  Administered 2017-02-19: 2 mg via INTRAVENOUS

## 2017-02-19 MED ORDER — ACETAMINOPHEN 325 MG PO TABS: 650.0000 mg | ORAL_TABLET | ORAL | Status: DC | PRN

## 2017-02-19 MED ORDER — SODIUM CHLORIDE 0.9 % IV SOLN
INTRAVENOUS | Status: DC
  Administered 2017-02-20 – 2017-02-21 (×4): via INTRAVENOUS

## 2017-02-19 MED ORDER — SODIUM CHLORIDE 0.9 % IV SOLN: 0.0000 ug/min | INTRAVENOUS | Status: DC

## 2017-02-19 MED ORDER — SODIUM CHLORIDE 0.9 % IR SOLN
Status: DC | PRN
  Administered 2017-02-19: 1000 mL

## 2017-02-19 MED ORDER — BISACODYL 10 MG RE SUPP: 10.0000 mg | Freq: Every day | RECTAL | Status: DC | PRN

## 2017-02-19 MED ORDER — PANTOPRAZOLE SODIUM 40 MG IV SOLR
40.0000 mg | Freq: Every day | INTRAVENOUS | Status: DC
  Administered 2017-02-20 – 2017-02-21 (×2): 40 mg via INTRAVENOUS
  Filled 2017-02-19 (×2): qty 40

## 2017-02-19 MED ORDER — PHENYLEPHRINE HCL 10 MG/ML IJ SOLN
INTRAMUSCULAR | Status: DC | PRN
  Administered 2017-02-19: 120 ug via INTRAVENOUS

## 2017-02-19 MED ORDER — IOPAMIDOL (ISOVUE-300) INJECTION 61%
INTRAVENOUS | Status: AC
  Filled 2017-02-19: qty 100

## 2017-02-19 SURGICAL SUPPLY — 55 items
BANDAGE ACE 4X5 VEL STRL LF (GAUZE/BANDAGES/DRESSINGS) ×5 IMPLANT
BANDAGE HEMOSTAT MRDH 4X4 STRL (MISCELLANEOUS) ×12 IMPLANT
BLADE CLIPPER SURG (BLADE) IMPLANT
BNDG HEMOSTAT MRDH 4X4 STRL (MISCELLANEOUS) ×20
CANISTER SUCT 3000ML PPV (MISCELLANEOUS) ×5 IMPLANT
CATH FOLEY 2WAY 5CC 16FR (CATHETERS) ×2
CATH URTH STD 16FR FL 2W DRN (CATHETERS) ×3 IMPLANT
COVER SURGICAL LIGHT HANDLE (MISCELLANEOUS) ×5 IMPLANT
DRAPE LAPAROSCOPIC ABDOMINAL (DRAPES) ×5 IMPLANT
DRAPE WARM FLUID 44X44 (DRAPE) ×5 IMPLANT
DRSG OPSITE POSTOP 4X10 (GAUZE/BANDAGES/DRESSINGS) IMPLANT
DRSG OPSITE POSTOP 4X8 (GAUZE/BANDAGES/DRESSINGS) IMPLANT
DRSG TEGADERM 4X4.75 (GAUZE/BANDAGES/DRESSINGS) ×10 IMPLANT
DRSG VAC ATS LRG SENSATRAC (GAUZE/BANDAGES/DRESSINGS) ×5 IMPLANT
ELECT BLADE 4.0 EZ CLEAN MEGAD (MISCELLANEOUS) ×5
ELECT BLADE 6.5 EXT (BLADE) ×5 IMPLANT
ELECT CAUTERY BLADE 6.4 (BLADE) ×5 IMPLANT
ELECT REM PT RETURN 9FT ADLT (ELECTROSURGICAL) ×5
ELECTRODE BLDE 4.0 EZ CLN MEGD (MISCELLANEOUS) ×3 IMPLANT
ELECTRODE REM PT RTRN 9FT ADLT (ELECTROSURGICAL) ×3 IMPLANT
GAUZE SPONGE 4X4 12PLY STRL (GAUZE/BANDAGES/DRESSINGS) ×5 IMPLANT
GAUZE SPONGE 4X4 12PLY STRL LF (GAUZE/BANDAGES/DRESSINGS) ×5 IMPLANT
GLOVE BIO SURGEON STRL SZ 6 (GLOVE) ×20 IMPLANT
GLOVE BIOGEL PI IND STRL 6.5 (GLOVE) ×12 IMPLANT
GLOVE BIOGEL PI INDICATOR 6.5 (GLOVE) ×8
GOWN STRL REUS W/ TWL LRG LVL3 (GOWN DISPOSABLE) ×6 IMPLANT
GOWN STRL REUS W/TWL 2XL LVL3 (GOWN DISPOSABLE) ×5 IMPLANT
GOWN STRL REUS W/TWL LRG LVL3 (GOWN DISPOSABLE) ×4
HANDPIECE INTERPULSE COAX TIP (DISPOSABLE) ×2
KIT BASIN OR (CUSTOM PROCEDURE TRAY) ×5 IMPLANT
KIT ROOM TURNOVER OR (KITS) ×5 IMPLANT
LIGASURE IMPACT 36 18CM CVD LR (INSTRUMENTS) IMPLANT
NS IRRIG 1000ML POUR BTL (IV SOLUTION) ×10 IMPLANT
PACK GENERAL/GYN (CUSTOM PROCEDURE TRAY) ×5 IMPLANT
PAD ABD 8X10 STRL (GAUZE/BANDAGES/DRESSINGS) ×30 IMPLANT
PAD ARMBOARD 7.5X6 YLW CONV (MISCELLANEOUS) ×5 IMPLANT
PAD CAST 4YDX4 CTTN HI CHSV (CAST SUPPLIES) ×3 IMPLANT
PADDING CAST COTTON 4X4 STRL (CAST SUPPLIES) ×2
PENCIL BUTTON HOLSTER BLD 10FT (ELECTRODE) ×5 IMPLANT
SET HNDPC FAN SPRY TIP SCT (DISPOSABLE) ×3 IMPLANT
SPECIMEN JAR LARGE (MISCELLANEOUS) IMPLANT
SPONGE LAP 18X18 X RAY DECT (DISPOSABLE) ×55 IMPLANT
STAPLER VISISTAT 35W (STAPLE) ×5 IMPLANT
SUCTION POOLE TIP (SUCTIONS) ×5 IMPLANT
SUT ETHILON 2 0 PSLX (SUTURE) ×10 IMPLANT
SUT SILK 2 0 SH CR/8 (SUTURE) ×5 IMPLANT
SUT SILK 3 0 SH CR/8 (SUTURE) ×5 IMPLANT
SUT VICRYL AB 2 0 TIES (SUTURE) ×10 IMPLANT
SUT VICRYL AB 3 0 TIES (SUTURE) ×10 IMPLANT
TOWEL OR 17X24 6PK STRL BLUE (TOWEL DISPOSABLE) ×5 IMPLANT
TOWEL OR 17X26 10 PK STRL BLUE (TOWEL DISPOSABLE) ×5 IMPLANT
TRAY FOLEY W/METER SILVER 16FR (SET/KITS/TRAYS/PACK) IMPLANT
TRAY WAYNE PNEUMOTHORAX 14X18 (TRAY / TRAY PROCEDURE) ×5 IMPLANT
WND VAC CANISTER 500ML (MISCELLANEOUS) ×10 IMPLANT
YANKAUER SUCT BULB TIP NO VENT (SUCTIONS) ×5 IMPLANT

## 2017-02-19 NOTE — Op Note (Signed)
Central Venous Catheter Insertion, arterial line Procedure Note [name] [id number] May 05, 1961  Procedure: Insertion of right femoral Central Venous Catheter (introducer sheath), right femoral arterial line Indications: inadequate IV access and requirement for continuous blood pressure monitoring  Procedure Details Consent: Unable to obtain consent because of emergent medical necessity.  Maximum sterile technique was used including antiseptics, gloves, gown, hand hygiene, mask and sheet. Skin prep: Chlorhexidine; local anesthetic administered A antimicrobial bonded/coated single lumen catheter was placed in the right femoral vein due to emergent situation using the Seldinger technique.  Evaluation Blood flow good Complications: No apparent complications Patient did tolerate procedure well. Secured.    22 gauge catheter was inserted into right femoral artery using the Seldinger technique.  Evaluation Blood flow good; BP tracing good. Complications: No apparent complications.    [name] 02/28/2017, 11:29 PM

## 2017-02-19 NOTE — ED Notes (Signed)
Third unit of blood [id number] started, MTP activated.

## 2017-02-19 NOTE — Anesthesia Preprocedure Evaluation (Signed)
Anesthesia Evaluation  Patient identified by MRN, date of birth, ID band Patient unresponsive    Reviewed: Allergy & Precautions, NPO status , Patient's Chart, lab work & pertinent test results  Airway Mallampati: Intubated       Dental   Pulmonary    Pulmonary exam normal        Cardiovascular Normal cardiovascular exam     Neuro/Psych PSYCHIATRIC DISORDERS    GI/Hepatic   Endo/Other    Renal/GU      Musculoskeletal   Abdominal   Peds  Hematology   Anesthesia Other Findings   Reproductive/Obstetrics                             Anesthesia Physical Anesthesia Plan  ASA: IV and emergent  Anesthesia Plan: General   Post-op Pain Management:    Induction: Intravenous  Airway Management Planned: Oral ETT  Additional Equipment: CVP  Intra-op Plan:   Post-operative Plan: Post-operative intubation/ventilation  Informed Consent: I have reviewed the patients History and Physical, chart, labs and discussed the procedure including the risks, benefits and alternatives for the proposed anesthesia with the patient or authorized representative who has indicated his/her understanding and acceptance.     Plan Discussed with: CRNA and Surgeon  Anesthesia Plan Comments:         Anesthesia Quick Evaluation

## 2017-02-19 NOTE — ED Notes (Signed)
Third unit of plasm hung at this time [id number], MTP activated by Donell BeersByerly MD.
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
## 2017-02-19 NOTE — ED Notes (Signed)
Unable to palpate BP in L arm.

## 2017-02-19 NOTE — Progress Notes (Signed)
Right femoral Arterial line placed by MD Donell BeersByerly

## 2017-02-19 NOTE — ED Notes (Signed)
To CT with this RN and Dr. Donell BeersByerly

## 2017-02-19 NOTE — ED Triage Notes (Signed)
Pt c/o 10/10 abd pain and nausea. Pt denies diarrhea. Pt A+OX4, speaking in complete sentences, ambulatory in triage.  EMS Vitals BP 140/78 P 60 SPO2 100% BSG 151

## 2017-02-19 NOTE — Anesthesia Procedure Notes (Signed)
Central Venous Catheter Insertion Performed by: Arta BruceSSEY, Jaquay Posthumus, anesthesiologist Start/End05/13/2018 9:50 PM, 02/19/2017 9:55 PM Patient location: Pre-op. Preanesthetic checklist: patient identified, IV checked, risks and benefits discussed, surgical consent, monitors and equipment checked, pre-op evaluation, timeout performed and anesthesia consent Position: Trendelenburg Patient sedated Hand hygiene performed , maximum sterile barriers used  and Seldinger technique used Catheter size: 12 Fr Total catheter length 16. Central line was placed.Double lumen Procedure performed using ultrasound guided technique. Ultrasound Notes:anatomy identified, needle tip was noted to be adjacent to the nerve/plexus identified, no ultrasound evidence of intravascular and/or intraneural injection and image(s) printed for medical record Attempts: 1 Following insertion, dressing applied and line sutured. Post procedure assessment: blood return through all ports  Patient tolerated the procedure well with no immediate complications.

## 2017-02-19 NOTE — ED Notes (Signed)
Entire back unstable per Byerly.

## 2017-02-19 NOTE — Progress Notes (Signed)
transported patient from CT straight to the OR. OR staff with patient now.

## 2017-02-19 NOTE — ED Notes (Signed)
Pt refused blood draw, saying, "it was going to hurt." Also, she said she is ready to go home and needs a way to get home.

## 2017-02-19 NOTE — Progress Notes (Signed)
Critical ABG values [facility]

## 2017-02-19 NOTE — ED Notes (Signed)
First unit of blood  [id number] hanging
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
## 2017-02-19 NOTE — ED Notes (Signed)
MD and RN made aware of critical lactic acid value
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
## 2017-02-19 NOTE — ED Notes (Signed)
Second unit plasma [id number]
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
## 2017-02-19 NOTE — ED Notes (Signed)
Second unit blood [id number] started on [place]

## 2017-02-19 NOTE — ED Notes (Signed)
Pt refused lab draw and urine collection. Pt states I need a bus pass to go home. Pt given bus pass.

## 2017-02-19 NOTE — ED Notes (Signed)
Dr. Donell BeersByerly prepping for introducer and aline
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
## 2017-02-19 NOTE — ED Notes (Signed)
Vitals validated per primary RN request due to pt going to OR.

## 2017-02-19 NOTE — Progress Notes (Signed)
Orthopedic Tech Progress Note Patient Details:  [name] January 07, 1961 [id number]  Ortho Devices Type of Ortho Device: Ace wrap, Post (short leg) splint Ortho Device/Splint Location: LLE Ortho Device/Splint Interventions: Ordered, Application   Jennye MoccasinHughes, [name] 02/23/2017, 8:00 PM

## 2017-02-20 ENCOUNTER — Inpatient Hospital Stay (HOSPITAL_COMMUNITY): Payer: Medicaid Other

## 2017-02-20 ENCOUNTER — Encounter (HOSPITAL_COMMUNITY): Payer: Self-pay | Admitting: Certified Registered Nurse Anesthetist

## 2017-02-20 ENCOUNTER — Encounter (HOSPITAL_COMMUNITY): Admission: EM | Disposition: E | Payer: Self-pay | Source: Home / Self Care

## 2017-02-20 ENCOUNTER — Encounter (HOSPITAL_COMMUNITY): Payer: Self-pay | Admitting: Emergency Medicine

## 2017-02-20 LAB — BLOOD GAS, ARTERIAL
Acid-base deficit: 3.9 mmol/L — ABNORMAL HIGH (ref 0.0–2.0)
BICARBONATE: 21.1 mmol/L (ref 20.0–28.0)
Drawn by: 24513
FIO2: 60
MECHVT: 500 mL
O2 SAT: 98.9 %
PATIENT TEMPERATURE: 94.7
PEEP: 5 cmH2O
RATE: 20 resp/min
pCO2 arterial: 37.9 mmHg (ref 32.0–48.0)
pH, Arterial: 7.352 (ref 7.350–7.450)
pO2, Arterial: 129 mmHg — ABNORMAL HIGH (ref 83.0–108.0)

## 2017-02-20 LAB — PREPARE PLATELET PHERESIS
UNIT DIVISION: 0
Unit division: 0

## 2017-02-20 LAB — URINALYSIS, ROUTINE W REFLEX MICROSCOPIC
LEUKOCYTES UA: NEGATIVE
NITRITE: NEGATIVE

## 2017-02-20 LAB — CBC
HCT: 29.3 % — ABNORMAL LOW (ref 36.0–46.0)
HCT: 21.5 % — ABNORMAL LOW (ref 36.0–46.0)
HCT: 20.2 % — ABNORMAL LOW (ref 36.0–46.0)
HEMATOCRIT: 23.9 % — AB (ref 36.0–46.0)
HEMATOCRIT: 25.4 % — AB (ref 36.0–46.0)
HEMOGLOBIN: 8.3 g/dL — AB (ref 12.0–15.0)
HEMOGLOBIN: 7.5 g/dL — AB (ref 12.0–15.0)
HEMOGLOBIN: 8.3 g/dL — AB (ref 12.0–15.0)
HEMOGLOBIN: 6.6 g/dL — AB (ref 12.0–15.0)
Hemoglobin: 10.3 g/dL — ABNORMAL LOW (ref 12.0–15.0)
MCH: 30.4 pg (ref 26.0–34.0)
MCH: 29.4 pg (ref 26.0–34.0)
MCH: 29.9 pg (ref 26.0–34.0)
MCH: 29.9 pg (ref 26.0–34.0)
MCH: 30.3 pg (ref 26.0–34.0)
MCHC: 32.7 g/dL (ref 30.0–36.0)
MCHC: 32.7 g/dL (ref 30.0–36.0)
MCHC: 34.7 g/dL (ref 30.0–36.0)
MCHC: 34.9 g/dL (ref 30.0–36.0)
MCHC: 35.2 g/dL (ref 30.0–36.0)
MCV: 84.8 fL (ref 78.0–100.0)
MCV: 85.7 fL (ref 78.0–100.0)
MCV: 86.4 fL (ref 78.0–100.0)
MCV: 91.4 fL (ref 78.0–100.0)
MCV: 92.7 fL (ref 78.0–100.0)
PLATELETS: 118 10*3/uL — AB (ref 150–400)
PLATELETS: 76 10*3/uL — AB (ref 150–400)
PLATELETS: 79 10*3/uL — AB (ref 150–400)
Platelets: 125 10*3/uL — ABNORMAL LOW (ref 150–400)
Platelets: 105 10*3/uL — ABNORMAL LOW (ref 150–400)
RBC: 2.18 MIL/uL — AB (ref 3.87–5.11)
RBC: 2.51 MIL/uL — AB (ref 3.87–5.11)
RBC: 2.78 MIL/uL — ABNORMAL LOW (ref 3.87–5.11)
RBC: 2.82 MIL/uL — ABNORMAL LOW (ref 3.87–5.11)
RBC: 3.39 MIL/uL — AB (ref 3.87–5.11)
RDW: 15.1 % (ref 11.5–15.5)
RDW: 15.3 % (ref 11.5–15.5)
RDW: 16.1 % — ABNORMAL HIGH (ref 11.5–15.5)
RDW: 16.2 % — ABNORMAL HIGH (ref 11.5–15.5)
RDW: 16.5 % — AB (ref 11.5–15.5)
WBC: 10 10*3/uL (ref 4.0–10.5)
WBC: 11 10*3/uL — AB (ref 4.0–10.5)
WBC: 4.2 10*3/uL (ref 4.0–10.5)
WBC: 4.7 10*3/uL (ref 4.0–10.5)
WBC: 5.7 10*3/uL (ref 4.0–10.5)

## 2017-02-20 LAB — POCT I-STAT 7, (LYTES, BLD GAS, ICA,H+H)
Acid-base deficit: 5 mmol/L — ABNORMAL HIGH (ref 0.0–2.0)
Bicarbonate: 23.4 mmol/L (ref 20.0–28.0)
CALCIUM ION: 0.57 mmol/L — AB (ref 1.15–1.40)
HCT: 22 % — ABNORMAL LOW (ref 36.0–46.0)
Hemoglobin: 7.5 g/dL — ABNORMAL LOW (ref 12.0–15.0)
O2 Saturation: 99 %
PH ART: 7.207 — AB (ref 7.350–7.450)
POTASSIUM: 5.9 mmol/L — AB (ref 3.5–5.1)
Patient temperature: 35
Sodium: 149 mmol/L — ABNORMAL HIGH (ref 135–145)
TCO2: 25 mmol/L (ref 0–100)
pCO2 arterial: 57.5 mmHg — ABNORMAL HIGH (ref 32.0–48.0)
pO2, Arterial: 184 mmHg — ABNORMAL HIGH (ref 83.0–108.0)

## 2017-02-20 LAB — PROTIME-INR
INR: 1.69
INR: 1.4
INR: 2.27
INR: 2.58
Prothrombin Time: 20.1 seconds — ABNORMAL HIGH (ref 11.4–15.2)
Prothrombin Time: 17.3 seconds — ABNORMAL HIGH (ref 11.4–15.2)
Prothrombin Time: 25.5 seconds — ABNORMAL HIGH (ref 11.4–15.2)
Prothrombin Time: 28.2 seconds — ABNORMAL HIGH (ref 11.4–15.2)

## 2017-02-20 LAB — COMPREHENSIVE METABOLIC PANEL
ALBUMIN: 1.8 g/dL — AB (ref 3.5–5.0)
ALBUMIN: 2.1 g/dL — AB (ref 3.5–5.0)
ALK PHOS: 42 U/L (ref 38–126)
ALT: 176 U/L — AB (ref 14–54)
ALT: 90 U/L — ABNORMAL HIGH (ref 14–54)
ANION GAP: 10 (ref 5–15)
AST: 167 U/L — ABNORMAL HIGH (ref 15–41)
AST: 352 U/L — AB (ref 15–41)
Alkaline Phosphatase: 37 U/L — ABNORMAL LOW (ref 38–126)
Anion gap: 10 (ref 5–15)
BUN: 13 mg/dL (ref 6–20)
BUN: 16 mg/dL (ref 6–20)
CALCIUM: 6.4 mg/dL — AB (ref 8.9–10.3)
CHLORIDE: 112 mmol/L — AB (ref 101–111)
CO2: 22 mmol/L (ref 22–32)
CO2: 22 mmol/L (ref 22–32)
CREATININE: 1.45 mg/dL — AB (ref 0.44–1.00)
Calcium: 6.5 mg/dL — ABNORMAL LOW (ref 8.9–10.3)
Chloride: 114 mmol/L — ABNORMAL HIGH (ref 101–111)
Creatinine, Ser: 1.1 mg/dL — ABNORMAL HIGH (ref 0.44–1.00)
GFR calc Af Amer: >60 mL/min (ref >60)
GFR calc Af Amer: 45 mL/min — ABNORMAL LOW (ref >60)
GFR calc non Af Amer: 55 mL/min — ABNORMAL LOW (ref >60)
GFR calc non Af Amer: 39 mL/min — ABNORMAL LOW (ref >60)
GLUCOSE: 121 mg/dL — AB (ref 65–99)
GLUCOSE: 159 mg/dL — AB (ref 65–99)
POTASSIUM: 3.7 mmol/L (ref 3.5–5.1)
Potassium: 3.7 mmol/L (ref 3.5–5.1)
Sodium: 146 mmol/L — ABNORMAL HIGH (ref 135–145)
Sodium: 144 mmol/L (ref 135–145)
TOTAL PROTEIN: 3.1 g/dL — AB (ref 6.5–8.1)
Total Bilirubin: 0.7 mg/dL (ref 0.3–1.2)
Total Bilirubin: 0.8 mg/dL (ref 0.3–1.2)
Total Protein: 3.8 g/dL — ABNORMAL LOW (ref 6.5–8.1)

## 2017-02-20 LAB — BPAM FFP
BLOOD PRODUCT EXPIRATION DATE: 201805152359
Blood Product Expiration Date: 201805152359
ISSUE DATE / TIME: 201805122030
ISSUE DATE / TIME: 201805122030
UNIT TYPE AND RH: 6200
Unit Type and Rh: 6200

## 2017-02-20 LAB — PREPARE FRESH FROZEN PLASMA
UNIT DIVISION: 0
Unit division: 0

## 2017-02-20 LAB — BPAM PLATELET PHERESIS
BLOOD PRODUCT EXPIRATION DATE: 201805132225
BLOOD PRODUCT EXPIRATION DATE: 201805142359
ISSUE DATE / TIME: 201805122228
ISSUE DATE / TIME: 201805122101
UNIT TYPE AND RH: 5100
UNIT TYPE AND RH: 6200

## 2017-02-20 LAB — URINALYSIS, MICROSCOPIC (REFLEX): SQUAMOUS EPITHELIAL / LPF: NONE SEEN

## 2017-02-20 LAB — DIC (DISSEMINATED INTRAVASCULAR COAGULATION)PANEL
INR: 1.54
Platelets: 124 10*3/uL — ABNORMAL LOW (ref 150–400)
Smear Review: NONE SEEN

## 2017-02-20 LAB — PREPARE RBC (CROSSMATCH)

## 2017-02-20 LAB — DIC (DISSEMINATED INTRAVASCULAR COAGULATION) PANEL
APTT: 45 s — AB (ref 24–36)
FIBRINOGEN: 188 mg/dL — AB (ref 210–475)
PROTHROMBIN TIME: 18.6 s — AB (ref 11.4–15.2)

## 2017-02-20 LAB — RAPID URINE DRUG SCREEN, HOSP PERFORMED
Amphetamines: NOT DETECTED
BARBITURATES: NOT DETECTED
BENZODIAZEPINES: POSITIVE — AB
COCAINE: POSITIVE — AB
Opiates: NOT DETECTED
TETRAHYDROCANNABINOL: NOT DETECTED

## 2017-02-20 LAB — LACTIC ACID, PLASMA: LACTIC ACID, VENOUS: 4.9 mmol/L — AB (ref 0.5–1.9)

## 2017-02-20 LAB — MRSA PCR SCREENING: MRSA BY PCR: NEGATIVE

## 2017-02-20 LAB — FIBRINOGEN: FIBRINOGEN: 261 mg/dL (ref 210–475)

## 2017-02-20 SURGERY — IRRIGATION AND DEBRIDEMENT EXTREMITY
Anesthesia: General | Laterality: Right

## 2017-02-20 MED ORDER — SODIUM CHLORIDE 0.9 % IV SOLN: Freq: Once | INTRAVENOUS | Status: DC

## 2017-02-20 MED ORDER — SODIUM CHLORIDE 0.9 % IV SOLN
0.0000 ug/min | INTRAVENOUS | Status: DC
  Administered 2017-02-20: 350 ug/min via INTRAVENOUS
  Administered 2017-02-20 (×3): 400 ug/min via INTRAVENOUS
  Administered 2017-02-20: 300 ug/min via INTRAVENOUS
  Administered 2017-02-20: 150 ug/min via INTRAVENOUS
  Administered 2017-02-21 (×5): 400 ug/min via INTRAVENOUS
  Administered 2017-02-21: 380 ug/min via INTRAVENOUS
  Administered 2017-02-21 (×2): 400 ug/min via INTRAVENOUS
  Administered 2017-02-21: 350 ug/min via INTRAVENOUS
  Administered 2017-02-21: 400 ug/min via INTRAVENOUS
  Filled 2017-02-20 (×16): qty 4

## 2017-02-20 MED ORDER — SODIUM CHLORIDE 0.9 % IV SOLN
Freq: Once | INTRAVENOUS | Status: AC
  Administered 2017-02-20: 12:00:00 via INTRAVENOUS

## 2017-02-20 MED ORDER — SODIUM CHLORIDE 0.9 % IV SOLN
1.0000 g | Freq: Once | INTRAVENOUS | Status: AC
  Administered 2017-02-20: 1 g via INTRAVENOUS
  Filled 2017-02-20: qty 10

## 2017-02-20 MED ORDER — PNEUMOCOCCAL VAC POLYVALENT 25 MCG/0.5ML IJ INJ: 0.5000 mL | INJECTION | INTRAMUSCULAR | Status: DC

## 2017-02-20 MED ORDER — NOREPINEPHRINE BITARTRATE 1 MG/ML IV SOLN
0.0000 ug/min | INTRAVENOUS | Status: DC
  Administered 2017-02-20: 7 ug/min via INTRAVENOUS
  Administered 2017-02-21: 40 ug/min via INTRAVENOUS
  Administered 2017-02-21: 30 ug/min via INTRAVENOUS
  Administered 2017-02-21 (×4): 40 ug/min via INTRAVENOUS
  Administered 2017-02-21: 30 ug/min via INTRAVENOUS
  Filled 2017-02-20 (×9): qty 4

## 2017-02-20 NOTE — Progress Notes (Signed)
Patient ID: [name], female   DOB: 04-03-1961, 56 y.o.   MRN: [id number] [facility] Progress Note:   1 Day Post-Op  Subjective: Mental status is intubated and unresponsive Objective: Vital signs in last 24 hours: Temp:  [94.1 F (34.5 C)-100 F (37.8 C)] 99.8 F (37.7 C) (05/13 1127) Pulse Rate:  [85-128] 102 (05/13 1200) Resp:  [15-33] 20 (05/13 1200) BP: (51-177)/(27-165) 88/62 (05/13 1200) SpO2:  [89 %-100 %] 99 % (05/13 1200) Arterial Line BP: (81-183)/(38-84) 97/51 (05/13 1200) FiO2 (%):  [40 %-100 %] 40 % (05/13 1136) Weight:  [90.7 kg (200 lb)] 90.7 kg (200 lb) (05/12 1951)  Intake/Output from previous day: 05/12 0701 - 05/13 0700 In: 44975.3 [I.V.:10881.3; YYFRT:02111; IV Piggyback:110] Out: 7356 [Urine:600; Drains:1100; Blood:1500; Chest Tube:190] Intake/Output this shift: Total I/O In: 1207.6 [I.V.:1027.6; Blood:180] Out: 70 [Urine:70]  Physical Exam: Work of breathing is vent controlled; Pupils-equal and pinpoint;  Neck in collar;  L Chest tube--breath sound R>L;  Bleeding from back wound continue to control with large pack and direct pressure;    Lab Results:  Results for orders placed or performed during the hospital encounter of 03/06/2017 (from the past 48 hour(s))  Prepare fresh frozen plasma     Status: None (Preliminary result)   Collection Time: 02/27/2017  7:22 PM  Result Value Ref Range   Unit Number [id number]    Blood Component Type LIQ PLASMA    Unit division 00    Status of Unit ISSUED,FINAL    Unit tag comment VERBAL ORDERS PER DR LOCKWOOD    Transfusion Status OK TO TRANSFUSE    Unit Number [id number]    Blood Component Type LIQ PLASMA    Unit division 00    Status of Unit ISSUED,FINAL    Unit tag comment VERBAL ORDERS PER DR LOCKWOOD    Transfusion Status OK TO TRANSFUSE    Unit Number [id number]    Blood Component Type LIQ PLASMA    Unit division 00    Status of Unit ISSUED,FINAL    Unit tag comment VERBAL ORDERS  PER DR BYERLY    Transfusion Status OK TO TRANSFUSE    Unit Number [id number]    Blood Component Type LIQ PLASMA    Unit division 00    Status of Unit ISSUED,FINAL    Unit tag comment VERBAL ORDERS PER DR BYERLY    Transfusion Status OK TO TRANSFUSE    Unit Number [id number]    Blood Component Type THWPLS APHR3    Unit division 00    Status of Unit ISSUED,FINAL    Unit tag comment VERBAL ORDERS PER DR    Transfusion Status OK TO TRANSFUSE    Unit Number [id number]    Blood Component Type THAWED PLASMA    Unit division 00    Status of Unit ISSUED,FINAL    Unit tag comment VERBAL ORDERS PER DR BYERLY    Transfusion Status OK TO TRANSFUSE    Unit Number [id number]    Blood Component Type THWPLS APHR4    Unit division 00    Status of Unit ISSUED,FINAL    Unit tag comment VERBAL ORDERS PER DR BYERLY    Transfusion Status OK TO TRANSFUSE    Unit Number [id number]    Blood Component Type THWPLS APHR2    Unit division 00    Status of Unit ISSUED,FINAL    Unit tag comment VERBAL ORDERS PER DR BYERLY    Transfusion Status OK TO TRANSFUSE  Unit Number [id number]    Blood Component Type THAWED PLASMA    Unit division 00    Status of Unit ISSUED,FINAL    Unit tag comment VERBAL ORDERS PER DR BYERLY    Transfusion Status OK TO TRANSFUSE    Unit Number [id number]    Blood Component Type THWPLS APHR2    Unit division 00    Status of Unit ISSUED,FINAL    Unit tag comment VERBAL ORDERS PER DR BYERLY    Transfusion Status OK TO TRANSFUSE    Unit Number [id number]    Blood Component Type THWPLS APHR1    Unit division 00    Status of Unit ISSUED,FINAL    Unit tag comment VERBAL ORDERS PER DR BYERLY    Transfusion Status OK TO TRANSFUSE    Unit Number [id number]    Blood Component Type THWPLS APHR1    Unit division 00    Status of Unit ISSUED,FINAL    Unit tag comment VERBAL ORDERS PER DR BYERLY    Transfusion Status OK TO TRANSFUSE    Unit Number  [id number]    Blood Component Type THWPLS APHR1    Unit division 00    Status of Unit ISSUED,FINAL    Unit tag comment VERBAL ORDERS PER DR BYERLY    Transfusion Status OK TO TRANSFUSE    Unit Number [id number]    Blood Component Type THWPLS APHR1    Unit division 00    Status of Unit ISSUED,FINAL    Unit tag comment VERBAL ORDERS PER DR BYERLY    Transfusion Status OK TO TRANSFUSE    Unit Number [id number]    Blood Component Type THW PLS APHR    Unit division 00    Status of Unit ISSUED,FINAL    Unit tag comment VERBAL ORDERS PER DR BYERLY    Transfusion Status OK TO TRANSFUSE    Unit Number [id number]    Blood Component Type THWPLS APHR2    Unit division 00    Status of Unit ISSUED,FINAL    Unit tag comment VERBAL ORDERS PER DR BYERLY    Transfusion Status OK TO TRANSFUSE    Unit Number [id number]    Blood Component Type THAWED PLASMA    Unit division 00    Status of Unit REL FROM [facility]    Unit tag comment VERBAL ORDERS PER DR BYERLY    Transfusion Status OK TO TRANSFUSE    Unit Number [id number]    Blood Component Type THAWED PLASMA    Unit division 00    Status of Unit REL FROM [facility]    Unit tag comment VERBAL ORDERS PER DR BYERLY    Transfusion Status OK TO TRANSFUSE    Unit Number [id number]    Blood Component Type THAWED PLASMA    Unit division 00    Status of Unit REL FROM [facility]    Unit tag comment VERBAL ORDERS PER DR BYERLY    Transfusion Status OK TO TRANSFUSE    Unit Number [id number]    Blood Component Type THAWED PLASMA    Unit division 00    Status of Unit REL FROM [facility]    Unit tag comment VERBAL ORDERS PER DR BYERLY    Transfusion Status OK TO TRANSFUSE    Unit Number [id number]    Blood Component Type THAWED PLASMA    Unit division 00    Status of Unit REL FROM [facility]    Unit tag comment VERBAL ORDERS PER DR BYERLY  Transfusion Status OK TO TRANSFUSE    Unit Number [id number]    Blood Component Type THAWED PLASMA     Unit division 00    Status of Unit REL FROM [facility]    Unit tag comment VERBAL ORDERS PER DR BYERLY    Transfusion Status OK TO TRANSFUSE    Unit Number [id number]    Blood Component Type THAWED PLASMA    Unit division 00    Status of Unit REL FROM [facility]    Unit tag comment VERBAL ORDERS PER DR BYERLY    Transfusion Status OK TO TRANSFUSE    Unit Number [id number]    Blood Component Type THAWED PLASMA    Unit division 00    Status of Unit REL FROM [facility]    Unit tag comment VERBAL ORDERS PER DR BYERLY    Transfusion Status OK TO TRANSFUSE    Unit Number [id number]    Blood Component Type THAWED PLASMA    Unit division 00    Status of Unit ISSUED,FINAL    Unit tag comment VERBAL ORDERS PER DR BYERLY    Transfusion Status OK TO TRANSFUSE    Unit Number [id number]    Blood Component Type THWPLS APHR1    Unit division 00    Status of Unit ISSUED,FINAL    Unit tag comment VERBAL ORDERS PER DR BYERLY    Transfusion Status OK TO TRANSFUSE    Unit Number [id number]    Blood Component Type THWPLS APHR1    Unit division 00    Status of Unit REL FROM [facility]    Unit tag comment VERBAL ORDERS PER DR BYERLY    Transfusion Status OK TO TRANSFUSE    Unit Number [id number]    Blood Component Type THWPLS APHR2    Unit division 00    Status of Unit REL FROM [facility]    Unit tag comment VERBAL ORDERS PER DR BYERLY    Transfusion Status OK TO TRANSFUSE    Unit Number [id number]    Blood Component Type THAWED PLASMA    Unit division 00    Status of Unit ALLOCATED    Unit tag comment VERBAL ORDERS PER DR BYERLY    Transfusion Status OK TO TRANSFUSE    Unit Number [id number]    Blood Component Type THAWED PLASMA    Unit division 00    Status of Unit ALLOCATED    Unit tag comment VERBAL ORDERS PER DR BYERLY    Transfusion Status OK TO TRANSFUSE    Unit Number [id number]    Blood Component Type THAWED PLASMA    Unit division 00    Status of Unit ALLOCATED    Unit  tag comment VERBAL ORDERS PER DR BYERLY    Transfusion Status OK TO TRANSFUSE    Unit Number [id number]    Blood Component Type THAWED PLASMA    Unit division 00    Status of Unit ALLOCATED    Unit tag comment VERBAL ORDERS PER DR BYERLY    Transfusion Status OK TO TRANSFUSE   Type and screen     Status: None (Preliminary result)   Collection Time: 02/11/2017  7:22 PM  Result Value Ref Range   ABO/RH(D) PENDING    Antibody Screen NEG    Sample Expiration 02/22/2017    Unit Number [id number]    Blood Component Type RBC LR PHER2    Unit division 00    Status of Unit ISSUED,FINAL    Unit tag comment VERBAL  ORDERS PER DR LOCKWOOD    Transfusion Status OK TO TRANSFUSE    Crossmatch Result COMPATIBLE    Unit Number [id number]    Blood Component Type RBC LR PHER1    Unit division 00    Status of Unit ISSUED,FINAL    Unit tag comment VERBAL ORDERS PER DR LOCKWOOD    Transfusion Status OK TO TRANSFUSE    Crossmatch Result COMPATIBLE    Unit Number [id number]    Blood Component Type RBC CPDA1, LR    Unit division 00    Status of Unit ISSUED,FINAL    Unit tag comment VERBAL ORDERS PER DR LOCKWOOD    Transfusion Status OK TO TRANSFUSE    Crossmatch Result COMPATIBLE    Unit Number [id number]    Blood Component Type RBC CPDA1, LR    Unit division 00    Status of Unit ISSUED,FINAL    Unit tag comment VERBAL ORDERS PER DR LOCKWOOD    Transfusion Status OK TO TRANSFUSE    Crossmatch Result COMPATIBLE    Unit Number [id number]    Blood Component Type RBC CPDA1, LR    Unit division 00    Status of Unit ISSUED,FINAL    Unit tag comment VERBAL ORDERS PER DR LOCKWOOD    Transfusion Status OK TO TRANSFUSE    Crossmatch Result COMPATIBLE    Unit Number [id number]    Blood Component Type RBC LR PHER2    Unit division 00    Status of Unit ISSUED,FINAL    Unit tag comment VERBAL ORDERS PER DR LOCKWOOD    Transfusion Status OK TO TRANSFUSE    Crossmatch Result  COMPATIBLE    Unit Number [id number]    Blood Component Type RED CELLS,LR    Unit division 00    Status of Unit ISSUED,FINAL    Unit tag comment VERBAL ORDERS PER DR BYERLY    Transfusion Status OK TO TRANSFUSE    Crossmatch Result COMPATIBLE    Unit Number [id number]    Blood Component Type RED CELLS,LR    Unit division 00    Status of Unit ISSUED,FINAL    Unit tag comment VERBAL ORDERS PER DR BYERLY    Transfusion Status OK TO TRANSFUSE    Crossmatch Result COMPATIBLE    Unit Number [id number]    Blood Component Type RBC CPDA1, LR    Unit division 00    Status of Unit ISSUED,FINAL    Unit tag comment VERBAL ORDERS PER DR BYERLY    Transfusion Status OK TO TRANSFUSE    Crossmatch Result COMPATIBLE    Unit Number [id number]    Blood Component Type RED CELLS,LR    Unit division 00    Status of Unit ISSUED,FINAL    Unit tag comment VERBAL ORDERS PER DR BYERLY    Transfusion Status OK TO TRANSFUSE    Crossmatch Result COMPATIBLE    Unit Number [id number]    Blood Component Type RED CELLS,LR    Unit division 00    Status of Unit ISSUED,FINAL    Unit tag comment VERBAL ORDERS PER DR BYERLY    Transfusion Status OK TO TRANSFUSE    Crossmatch Result COMPATIBLE    Unit Number [id number]    Blood Component Type RBC LR PHER2    Unit division 00    Status of Unit ISSUED,FINAL    Unit tag comment VERBAL ORDERS PER DR BYERLY    Transfusion Status OK TO TRANSFUSE    Crossmatch Result COMPATIBLE  Unit Number [id number]    Blood Component Type RBC LR PHER1    Unit division 00    Status of Unit ISSUED,FINAL    Unit tag comment VERBAL ORDERS PER DR BYERLY    Transfusion Status OK TO TRANSFUSE    Crossmatch Result COMPATIBLE    Unit Number [id number]    Blood Component Type RBC LR PHER2    Unit division 00    Status of Unit ISSUED,FINAL    Unit tag comment VERBAL ORDERS PER DR BYERLY    Transfusion Status OK TO TRANSFUSE    Crossmatch Result COMPATIBLE     Unit Number [id number]    Blood Component Type RED CELLS,LR    Unit division 00    Status of Unit ISSUED,FINAL    Unit tag comment VERBAL ORDERS PER DR BYERLY    Transfusion Status OK TO TRANSFUSE    Crossmatch Result COMPATIBLE    Unit Number [id number]    Blood Component Type RED CELLS,LR    Unit division 00    Status of Unit ISSUED,FINAL    Unit tag comment VERBAL ORDERS PER DR BYERLY    Transfusion Status OK TO TRANSFUSE    Crossmatch Result COMPATIBLE    Unit Number [id number]    Blood Component Type RED CELLS,LR    Unit division 00    Status of Unit ISSUED,FINAL    Unit tag comment VERBAL ORDERS PER DR BYERLY    Transfusion Status OK TO TRANSFUSE    Crossmatch Result COMPATIBLE    Unit Number [id number]    Blood Component Type RBC LR PHER1    Unit division 00    Status of Unit ISSUED,FINAL    Unit tag comment VERBAL ORDERS PER DR BYERLY    Transfusion Status OK TO TRANSFUSE    Crossmatch Result COMPATIBLE    Unit Number [id number]    Blood Component Type RED CELLS,LR    Unit division 00    Status of Unit ISSUED,FINAL    Transfusion Status OK TO TRANSFUSE    Crossmatch Result COMPATIBLE    Unit tag comment VERBAL ORDERS PER DR BYERLY    Unit Number [id number]    Blood Component Type RED CELLS,LR    Unit division 00    Status of Unit ISSUED,FINAL    Transfusion Status OK TO TRANSFUSE    Crossmatch Result COMPATIBLE    Unit tag comment VERBAL ORDERS PER DR BYERLY    Unit Number [id number]    Blood Component Type RED CELLS,LR    Unit division 00    Status of Unit ISSUED,FINAL    Transfusion Status OK TO TRANSFUSE    Crossmatch Result COMPATIBLE    Unit tag comment VERBAL ORDERS PER DR BYERLY    Unit Number [id number]    Blood Component Type RED CELLS,LR    Unit division 00    Status of Unit ISSUED,FINAL    Transfusion Status OK TO TRANSFUSE    Crossmatch Result COMPATIBLE    Unit tag comment VERBAL ORDERS PER DR BYERLY    Unit  Number [id number]    Blood Component Type RED CELLS,LR    Unit division 00    Status of Unit ISSUED,FINAL    Unit tag comment VERBAL ORDERS PER DR BYERLY    Transfusion Status OK TO TRANSFUSE    Crossmatch Result COMPATIBLE    Unit Number [id number]    Blood Component Type RED CELLS,LR    Unit division 00    Status of Unit  ISSUED,FINAL    Unit tag comment VERBAL ORDERS PER DR BYERLY    Transfusion Status OK TO TRANSFUSE    Crossmatch Result COMPATIBLE    Unit Number [id number]    Blood Component Type RBC LR PHER1    Unit division 00    Status of Unit ISSUED,FINAL    Unit tag comment VERBAL ORDERS PER DR BYERLY    Transfusion Status OK TO TRANSFUSE    Crossmatch Result COMPATIBLE    Unit Number [id number]    Blood Component Type RED CELLS,LR    Unit division 00    Status of Unit ISSUED,FINAL    Unit tag comment VERBAL ORDERS PER DR BYERLY    Transfusion Status OK TO TRANSFUSE    Crossmatch Result COMPATIBLE    Unit Number [id number]    Blood Component Type RED CELLS,LR    Unit division 00    Status of Unit REL FROM [facility]    Transfusion Status OK TO TRANSFUSE    Crossmatch Result COMPATIBLE    Unit tag comment VERBAL ORDERS PER DR BYERLY    Unit Number [id number]    Blood Component Type RED CELLS,LR    Unit division 00    Status of Unit REL FROM [facility]    Transfusion Status OK TO TRANSFUSE    Crossmatch Result COMPATIBLE    Unit tag comment VERBAL ORDERS PER DR BYERLY    Unit Number [id number]    Blood Component Type RED CELLS,LR    Unit division 00    Status of Unit REL FROM [facility]    Transfusion Status OK TO TRANSFUSE    Crossmatch Result COMPATIBLE    Unit tag comment VERBAL ORDERS PER DR BYERLY    Unit Number [id number]    Blood Component Type RED CELLS,LR    Unit division 00    Status of Unit REL FROM [facility]    Transfusion Status OK TO TRANSFUSE    Crossmatch Result COMPATIBLE    Unit tag comment VERBAL ORDERS PER DR BYERLY    Unit  Number [id number]    Blood Component Type RED CELLS,LR    Unit division 00    Status of Unit ALLOCATED    Unit tag comment VERBAL ORDERS PER DR BYERLY    Transfusion Status OK TO TRANSFUSE    Crossmatch Result PENDING    Unit Number [id number]    Blood Component Type RBC LR PHER1    Unit division 00    Status of Unit ALLOCATED    Unit tag comment VERBAL ORDERS PER DR BYERLY    Transfusion Status OK TO TRANSFUSE    Crossmatch Result PENDING    Unit Number [id number]    Blood Component Type RED CELLS,LR    Unit division 00    Status of Unit ALLOCATED    Unit tag comment VERBAL ORDERS PER DR BYERLY    Transfusion Status OK TO TRANSFUSE    Crossmatch Result PENDING    Unit Number [id number]    Blood Component Type RED CELLS,LR    Unit division 00    Status of Unit ALLOCATED    Unit tag comment VERBAL ORDERS PER DR BYERLY    Transfusion Status OK TO TRANSFUSE    Crossmatch Result PENDING   I-Stat Chem 8, ED     Status: Abnormal   Collection Time: 02/15/2017  7:50 PM  Result Value Ref Range   Sodium 130 (L) 135 - 145 mmol/L   Potassium 6.0 (H) 3.5 - 5.1 mmol/L  Chloride 102 101 - 111 mmol/L   BUN 14 6 - 20 mg/dL   Creatinine, Ser 1.00 0.44 - 1.00 mg/dL   Glucose, Bld 119 (H) 65 - 99 mg/dL   Calcium, Ion 0.86 (LL) 1.15 - 1.40 mmol/L   TCO2 18 0 - 100 mmol/L   Hemoglobin 10.2 (L) 12.0 - 15.0 g/dL   HCT 30.0 (L) 36.0 - 46.0 %  I-Stat CG4 Lactic Acid, ED     Status: Abnormal   Collection Time: 03/03/2017  7:50 PM  Result Value Ref Range   Lactic Acid, Venous 4.80 (HH) 0.5 - 1.9 mmol/L   Comment NOTIFIED PHYSICIAN   Prepare fresh frozen plasma     Status: None   Collection Time: 02/17/2017  8:00 PM  Result Value Ref Range   Unit Number [id number]    Blood Component Type THAWED PLASMA    Unit division 00    Status of Unit ISSUED,FINAL    Unit tag comment VERBAL ORDERS PER DR LOCKWOOD    Transfusion Status OK TO TRANSFUSE    Unit Number [id number]    Blood  Component Type THWPLS APHR2    Unit division 00    Status of Unit ISSUED,FINAL    Unit tag comment VERBAL ORDERS PER DR LOCKWOOD    Transfusion Status OK TO TRANSFUSE   CDS serology     Status: None   Collection Time: 02/16/2017  8:03 PM  Result Value Ref Range   CDS serology specimen STAT   Comprehensive metabolic panel     Status: Abnormal   Collection Time: 02/17/2017  8:03 PM  Result Value Ref Range   Sodium 124 (L) 135 - 145 mmol/L   Potassium 5.8 (H) 3.5 - 5.1 mmol/L    Comment: SPECIMEN HEMOLYZED. HEMOLYSIS MAY AFFECT INTEGRITY OF RESULTS.   Chloride 99 (L) 101 - 111 mmol/L   CO2 14 (L) 22 - 32 mmol/L   Glucose, Bld 128 (H) 65 - 99 mg/dL   BUN 11 6 - 20 mg/dL   Creatinine, Ser 0.91 0.44 - 1.00 mg/dL   Calcium 6.8 (L) 8.9 - 10.3 mg/dL   Total Protein 4.2 (L) 6.5 - 8.1 g/dL   Albumin 2.4 (L) 3.5 - 5.0 g/dL   AST 208 (H) 15 - 41 U/L   ALT 120 (H) 14 - 54 U/L   Alkaline Phosphatase 41 38 - 126 U/L   Total Bilirubin 1.4 (H) 0.3 - 1.2 mg/dL   GFR calc non Af Amer >60 >60 mL/min   GFR calc Af Amer >60 >60 mL/min    Comment: (NOTE) The eGFR has been calculated using the CKD EPI equation. This calculation has not been validated in all clinical situations. eGFR's persistently <60 mL/min signify possible Chronic Kidney Disease.    Anion gap 11 5 - 15  CBC     Status: Abnormal   Collection Time: 03/06/2017  8:03 PM  Result Value Ref Range   WBC 12.9 (H) 4.0 - 10.5 K/uL   RBC 2.63 (L) 3.87 - 5.11 MIL/uL   Hemoglobin 8.1 (L) 12.0 - 15.0 g/dL    Comment: REPEATED TO VERIFY DELTA CHECK NOTED    HCT 25.5 (L) 36.0 - 46.0 %   MCV 97.0 78.0 - 100.0 fL   MCH 30.8 26.0 - 34.0 pg   MCHC 31.8 30.0 - 36.0 g/dL   RDW 14.7 11.5 - 15.5 %   Platelets 154 150 - 400 K/uL  Ethanol     Status: None  Collection Time: 03/07/2017  8:03 PM  Result Value Ref Range   Alcohol, Ethyl (B) <5 <5 mg/dL    Comment:        LOWEST DETECTABLE LIMIT FOR SERUM ALCOHOL IS 5 mg/dL FOR MEDICAL PURPOSES ONLY    Protime-INR     Status: Abnormal   Collection Time: 02/14/2017  8:03 PM  Result Value Ref Range   Prothrombin Time 18.3 (H) 11.4 - 15.2 seconds   INR 1.50   Prepare RBC     Status: None   Collection Time: 03/04/2017  8:03 PM  Result Value Ref Range   Order Confirmation ORDER PROCESSED BY BLOOD BANK   I-Stat arterial blood gas, ED     Status: Abnormal   Collection Time: 02/28/2017  8:48 PM  Result Value Ref Range   pH, Arterial 7.090 (LL) 7.350 - 7.450   pCO2 arterial 47.1 32.0 - 48.0 mmHg   pO2, Arterial 341.0 (H) 83.0 - 108.0 mmHg   Bicarbonate 14.3 (L) 20.0 - 28.0 mmol/L   TCO2 16 0 - 100 mmol/L   O2 Saturation 100.0 %   Acid-base deficit 14.0 (H) 0.0 - 2.0 mmol/L   Patient temperature 98.6 F    Sample type ARTERIAL    Comment NOTIFIED PHYSICIAN   DIC (disseminated intravasc coag) panel (STAT)     Status: Abnormal   Collection Time: 02/16/2017  8:59 PM  Result Value Ref Range   Prothrombin Time 20.9 (H) 11.4 - 15.2 seconds   INR 1.78    aPTT 59 (H) 24 - 36 seconds    Comment:        IF BASELINE aPTT IS ELEVATED, SUGGEST PATIENT RISK ASSESSMENT BE USED TO DETERMINE APPROPRIATE ANTICOAGULANT THERAPY.    Fibrinogen 149 (L) 210 - 475 mg/dL   D-Dimer, Quant 14.19 (H) 0.00 - 0.50 ug/mL-FEU    Comment: (NOTE) At the manufacturer cut-off of 0.50 ug/mL FEU, this assay has been documented to exclude PE with a sensitivity and negative predictive value of 97 to 99%.  At this time, this assay has not been approved by the FDA to exclude DVT/VTE. Results should be correlated with clinical presentation.    Platelets 39 (L) 150 - 400 K/uL    Comment: DELTA CHECK NOTED REPEATED TO VERIFY PLATELET COUNT CONFIRMED BY SMEAR    Smear Review NO SCHISTOCYTES SEEN   Initiate MTP (Blood Bank Notification)     Status: None   Collection Time: 02/11/2017  8:59 PM  Result Value Ref Range   Initiate Massive Transfusion Protocol MTP ACTIVATED 02/09/2017 2045,BYERLY   Prepare platelet pheresis     Status:  None   Collection Time: 02/16/2017  8:59 PM  Result Value Ref Range   Unit Number [id number]    Blood Component Type PLTP LR2 PAS    Unit division 00    Status of Unit ISSUED,FINAL    Unit tag comment VERBAL ORDERS PER DR BYERLY    Transfusion Status OK TO TRANSFUSE    Unit Number [id number]    Blood Component Type PLTP LR1 PAS    Unit division 00    Status of Unit ISSUED,FINAL    Transfusion Status OK TO TRANSFUSE    Unit tag comment VERBAL ORDERS PER DR BYERLY   Prepare cryoprecipitate     Status: None (Preliminary result)   Collection Time: 02/12/2017  9:43 PM  Result Value Ref Range   Unit Number [id number]    Blood Component Type CRYPOOL THAW    Unit division  00    Status of Unit ISSUED,FINAL    Transfusion Status OK TO TRANSFUSE    Unit tag comment VERBAL ORDERS PER DR [place]    Unit Number [id number]    Blood Component Type CRYPOOL THAW    Unit division 00    Status of Unit ISSUED    Transfusion Status OK TO TRANSFUSE    Unit tag comment VERBAL ORDERS PER DR [place]    Unit Number [id number]    Blood Component Type CRYPOOL THAW    Unit division 00    Status of Unit ISSUED,FINAL    Transfusion Status OK TO TRANSFUSE    Unit tag comment VERBAL ORDERS PER DR BYERLY   I-STAT 7, (LYTES, BLD GAS, ICA, H+H)     Status: Abnormal   Collection Time: 03/02/2017 10:38 PM  Result Value Ref Range   pH, Arterial 7.207 (L) 7.350 - 7.450   pCO2 arterial 57.5 (H) 32.0 - 48.0 mmHg   pO2, Arterial 184.0 (H) 83.0 - 108.0 mmHg   Bicarbonate 23.4 20.0 - 28.0 mmol/L   TCO2 25 0 - 100 mmol/L   O2 Saturation 99.0 %   Acid-base deficit 5.0 (H) 0.0 - 2.0 mmol/L   Sodium 149 (H) 135 - 145 mmol/L   Potassium 5.9 (H) 3.5 - 5.1 mmol/L   Calcium, Ion 0.57 (LL) 1.15 - 1.40 mmol/L   HCT 22.0 (L) 36.0 - 46.0 %   Hemoglobin 7.5 (L) 12.0 - 15.0 g/dL   Patient temperature 35.0 C    Sample type ARTERIAL    Comment NOTIFIED PHYSICIAN   Type and screen     Status: None (Preliminary result)    Collection Time: 02/10/2017 11:00 PM  Result Value Ref Range   ABO/RH(D) PENDING    Antibody Screen NEG    Sample Expiration 02/22/2017   Lactic acid, plasma     Status: Abnormal   Collection Time: 03/08/2017 11:20 PM  Result Value Ref Range   Lactic Acid, Venous 4.9 (HH) 0.5 - 1.9 mmol/L    Comment: CRITICAL RESULT CALLED TO, READ BACK BY AND VERIFIED WITH: THOMAS LILLY,RN AT 1435 02/25/2017 BY ZBEECH.   Blood gas, arterial     Status: Abnormal   Collection Time: 03/01/2017 11:50 PM  Result Value Ref Range   FIO2 60.00    Delivery systems VENTILATOR    Mode PRESSURE REGULATED VOLUME CONTROL    VT 500 mL   LHR 20 resp/min   Peep/cpap 5.0 cm H20   pH, Arterial 7.352 7.350 - 7.450   pCO2 arterial 37.9 32.0 - 48.0 mmHg   pO2, Arterial 129 (H) 83.0 - 108.0 mmHg   Bicarbonate 21.1 20.0 - 28.0 mmol/L   Acid-base deficit 3.9 (H) 0.0 - 2.0 mmol/L   O2 Saturation 98.9 %   Patient temperature 94.7    Collection site A-LINE    Drawn by [phone]    Sample type ARTERIAL DRAW   CBC     Status: Abnormal   Collection Time: 02/22/2017 11:51 PM  Result Value Ref Range   WBC 4.7 4.0 - 10.5 K/uL   RBC 3.39 (L) 3.87 - 5.11 MIL/uL   Hemoglobin 10.3 (L)

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
## 2017-02-20 NOTE — Progress Notes (Signed)
While turning patient to change bloody bed sheet, it was noted by two RNs the patient has an open wound on the L side of her back. It is bleeding moderately- attempted pt pack but BP dropping. Pt put back on her back. BP stabilized. MD notified- instructed to continue with packing and keep on back to prevent bleeding.

## 2017-02-20 NOTE — Transfer of Care (Signed)
Immediate Anesthesia Transfer of Care Note  Patient: [name]  Procedure(s) Performed: Procedure(s) with comments: EXPLORATORY LAPAROTOMY (N/A) IRRIGATION AND DEBRIDEMENT WOUND - Irrigation and debridement of back 19 inches with complex wound closure. CHEST TUBE INSERTION (Left)  Patient Location: ICU  Anesthesia Type:General  Level of Consciousness: Patient remains intubated per anesthesia plan  Airway & Oxygen Therapy: Patient remains intubated per anesthesia plan and Patient placed on Ventilator (see vital sign flow sheet for setting)  Post-op Assessment: Report given to RN and Post -op Vital signs reviewed and stable  Post vital signs: Reviewed and stable  Last Vitals:  Vitals:   03/06/2017 2057 02/26/2017 2345  BP: (!) 130/53 (!) 85/61  Pulse: 90 92  Resp: 20 (!) 24  Temp:  (!) 34.6 C    Last Pain:  Vitals:   02/12/2017 2345  TempSrc: Axillary         Complications: No apparent anesthesia complications

## 2017-02-20 NOTE — Progress Notes (Signed)
CRITICAL VALUE ALERT  Critical value received:  Calcium of 6.4   Date of notification:  5/13  Time of notification:  0515  Critical value read back yes   Nurse who received alert:  Lily Peerhomas Letizia Hook  MD notified (1st page):  Donell BeersByerly  Time of first page:  0530  MD notified (2nd page): byerly  Time of second page:0545  Time  of 3rd page 0600  Responding MD:  byerly  Time MD responded:  [phone]

## 2017-02-20 NOTE — Progress Notes (Signed)
Notified Urology MD of little to no urine output post irrigation done earlier by urology. Order to irrigate with 10cc gently. Completed- no resistance or return noted.
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
## 2017-02-20 NOTE — OR Nursing (Signed)
Baruch Goutyoss Steven MD Radiologist called at 2315 to review radiology results with Dr. Donell BeersByerly.  Dr. Donell BeersByerly informed of proper chest tube placement, NG tube placement and sponges that appear on xray in the abdomen and back.

## 2017-02-20 NOTE — Progress Notes (Signed)
Orthopedic Tech Progress Note Patient Details:  [name] September 25, 1961 [id number]  Ortho Devices Type of Ortho Device: Ace wrap, Long arm splint Ortho Device/Splint Location: rue Ortho Device/Splint Interventions: Application   [name] 03/02/2017, 7:22 AM
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
## 2017-02-20 NOTE — ED Notes (Signed)
FFP [id number]  Started in CT at 2050. Completed at 2100. Infused via [place] via MTP.  FFP [id number] given in CT at 2050. Completed at 2100. Infused via [place] via MTP.

## 2017-02-20 NOTE — Anesthesia Postprocedure Evaluation (Signed)
Anesthesia Post Note  Patient: Dorathy DaftMarie Sherfield  Procedure(s) Performed: Procedure(s) (LRB): EXPLORATORY LAPAROTOMY (N/A) IRRIGATION AND DEBRIDEMENT WOUND CHEST TUBE INSERTION (Left)  Patient location during evaluation: SICU Anesthesia Type: General Level of consciousness: sedated Pain management: pain level controlled Vital Signs Assessment: post-procedure vital signs reviewed and stable Respiratory status: patient remains intubated per anesthesia plan Cardiovascular status: stable Anesthetic complications: no       Last Vitals:  Vitals:   03-09-17 2057 03-09-17 2345  BP: (!) 130/53 (!) 85/61  Pulse: 90 92  Resp: 20 (!) 24  Temp:  (!) 34.6 C    Last Pain:  Vitals:   03-09-17 2345  TempSrc: Axillary                 Bentley Fissel DAVID

## 2017-02-20 NOTE — ED Notes (Signed)
Received call from [facility] regarding patient's history - per [facility] they have been following her for five years for schizoaffective disorder. Pt takes PO haldol and depakote. Hx substance abuse.

## 2017-02-21 ENCOUNTER — Inpatient Hospital Stay (HOSPITAL_COMMUNITY): Payer: Medicaid Other

## 2017-02-21 ENCOUNTER — Encounter (HOSPITAL_COMMUNITY): Payer: Self-pay | Admitting: Orthopedic Surgery

## 2017-02-21 DIAGNOSIS — F1910 Other psychoactive substance abuse, uncomplicated: Secondary | ICD-10-CM | POA: Diagnosis present

## 2017-02-21 LAB — BPAM CRYOPRECIPITATE
Blood Product Expiration Date: 201805130335
Blood Product Expiration Date: 201805130419
Blood Product Expiration Date: 201805130419
Blood Product Expiration Date: 201805131530
Blood Product Expiration Date: 201805131620
ISSUE DATE / TIME: 201805122145
ISSUE DATE / TIME: 201805122244
ISSUE DATE / TIME: 201805130041
ISSUE DATE / TIME: 201805130955
ISSUE DATE / TIME: 201805131041
UNIT TYPE AND RH: 6200
Unit Type and Rh: 5100
Unit Type and Rh: 5100
Unit Type and Rh: 5100
Unit Type and Rh: 6200

## 2017-02-21 LAB — PREPARE CRYOPRECIPITATE
UNIT DIVISION: 0
UNIT DIVISION: 0
Unit division: 0
Unit division: 0
Unit division: 0

## 2017-02-21 LAB — POCT I-STAT 7, (LYTES, BLD GAS, ICA,H+H)
ACID-BASE DEFICIT: 10 mmol/L — AB (ref 0.0–2.0)
Acid-base deficit: 10 mmol/L — ABNORMAL HIGH (ref 0.0–2.0)
Acid-base deficit: 6 mmol/L — ABNORMAL HIGH (ref 0.0–2.0)
Acid-base deficit: 5 mmol/L — ABNORMAL HIGH (ref 0.0–2.0)
BICARBONATE: 21.4 mmol/L (ref 20.0–28.0)
BICARBONATE: 20.7 mmol/L (ref 20.0–28.0)
Bicarbonate: 17.1 mmol/L — ABNORMAL LOW (ref 20.0–28.0)
Bicarbonate: 18.8 mmol/L — ABNORMAL LOW (ref 20.0–28.0)
CALCIUM ION: 0.57 mmol/L — AB (ref 1.15–1.40)
Calcium, Ion: 0.46 mmol/L — CL (ref 1.15–1.40)
Calcium, Ion: 0.67 mmol/L — CL (ref 1.15–1.40)
Calcium, Ion: 0.69 mmol/L — CL (ref 1.15–1.40)
HCT: 15 % — ABNORMAL LOW (ref 36.0–46.0)
HCT: 17 % — ABNORMAL LOW (ref 36.0–46.0)
HEMATOCRIT: 19 % — AB (ref 36.0–46.0)
HEMATOCRIT: 25 % — AB (ref 36.0–46.0)
HEMOGLOBIN: 5.8 g/dL — AB (ref 12.0–15.0)
HEMOGLOBIN: 6.5 g/dL — AB (ref 12.0–15.0)
Hemoglobin: 5.1 g/dL — CL (ref 12.0–15.0)
Hemoglobin: 8.5 g/dL — ABNORMAL LOW (ref 12.0–15.0)
O2 SAT: 100 %
O2 SAT: 98 %
O2 SAT: 100 %
O2 Saturation: 100 %
PCO2 ART: 47.2 mmHg (ref 32.0–48.0)
PCO2 ART: 36.1 mmHg (ref 32.0–48.0)
PH ART: 7.11 — AB (ref 7.350–7.450)
PO2 ART: 282 mmHg — AB (ref 83.0–108.0)
POTASSIUM: 4.4 mmol/L (ref 3.5–5.1)
POTASSIUM: 4.6 mmol/L (ref 3.5–5.1)
POTASSIUM: 5 mmol/L (ref 3.5–5.1)
Patient temperature: 34.5
Patient temperature: 35
Patient temperature: 35.2
Patient temperature: 34.8
Potassium: 4.6 mmol/L (ref 3.5–5.1)
SODIUM: 146 mmol/L — AB (ref 135–145)
SODIUM: 148 mmol/L — AB (ref 135–145)
Sodium: 148 mmol/L — ABNORMAL HIGH (ref 135–145)
Sodium: 150 mmol/L — ABNORMAL HIGH (ref 135–145)
TCO2: 18 mmol/L (ref 0–100)
TCO2: 21 mmol/L (ref 0–100)
TCO2: 23 mmol/L (ref 0–100)
TCO2: 22 mmol/L (ref 0–100)
pCO2 arterial: 41 mmHg (ref 32.0–48.0)
pCO2 arterial: 57.2 mmHg — ABNORMAL HIGH (ref 32.0–48.0)
pH, Arterial: 7.215 — ABNORMAL LOW (ref 7.350–7.450)
pH, Arterial: 7.254 — ABNORMAL LOW (ref 7.350–7.450)
pH, Arterial: 7.356 (ref 7.350–7.450)
pO2, Arterial: 115 mmHg — ABNORMAL HIGH (ref 83.0–108.0)
pO2, Arterial: 199 mmHg — ABNORMAL HIGH (ref 83.0–108.0)
pO2, Arterial: 226 mmHg — ABNORMAL HIGH (ref 83.0–108.0)

## 2017-02-21 LAB — POCT I-STAT 3, ART BLOOD GAS (G3+)
ACID-BASE DEFICIT: 24 mmol/L — AB (ref 0.0–2.0)
Acid-base deficit: 26 mmol/L — ABNORMAL HIGH (ref 0.0–2.0)
Acid-base deficit: 26 mmol/L — ABNORMAL HIGH (ref 0.0–2.0)
Acid-base deficit: 23 mmol/L — ABNORMAL HIGH (ref 0.0–2.0)
BICARBONATE: 6.6 mmol/L — AB (ref 20.0–28.0)
BICARBONATE: 6.5 mmol/L — AB (ref 20.0–28.0)
Bicarbonate: 7.1 mmol/L — ABNORMAL LOW (ref 20.0–28.0)
Bicarbonate: 7.5 mmol/L — ABNORMAL LOW (ref 20.0–28.0)
O2 SAT: 78 %
O2 Saturation: 95 %
O2 Saturation: 92 %
O2 Saturation: 93 %
PCO2 ART: 58.2 mmHg — AB (ref 32.0–48.0)
PCO2 ART: 39.8 mmHg (ref 32.0–48.0)
PCO2 ART: 42.3 mmHg (ref 32.0–48.0)
PO2 ART: 124 mmHg — AB (ref 83.0–108.0)
Patient temperature: 43
Patient temperature: 94.3
Patient temperature: 98
TCO2: 8 mmol/L (ref 0–100)
TCO2: 8 mmol/L (ref 0–100)
TCO2: 8 mmol/L (ref 0–100)
TCO2: 9 mmol/L (ref 0–100)
pCO2 arterial: 37.5 mmHg (ref 32.0–48.0)
pH, Arterial: 6.715 — CL (ref 7.350–7.450)
pH, Arterial: 6.825 — CL (ref 7.350–7.450)
pH, Arterial: 6.854 — CL (ref 7.350–7.450)
pH, Arterial: 6.855 — CL (ref 7.350–7.450)
pO2, Arterial: 119 mmHg — ABNORMAL HIGH (ref 83.0–108.0)
pO2, Arterial: 109 mmHg — ABNORMAL HIGH (ref 83.0–108.0)
pO2, Arterial: 113 mmHg — ABNORMAL HIGH (ref 83.0–108.0)

## 2017-02-21 LAB — BASIC METABOLIC PANEL
Anion gap: 28 — ABNORMAL HIGH (ref 5–15)
BUN: 25 mg/dL — AB (ref 6–20)
CO2: 7 mmol/L — ABNORMAL LOW (ref 22–32)
CREATININE: 4.27 mg/dL — AB (ref 0.44–1.00)
Calcium: 6 mg/dL — CL (ref 8.9–10.3)
Chloride: 107 mmol/L (ref 101–111)
GFR calc Af Amer: 12 mL/min — ABNORMAL LOW (ref >60)
GFR, EST NON AFRICAN AMERICAN: 11 mL/min — AB (ref >60)
GLUCOSE: 167 mg/dL — AB (ref 65–99)
Potassium: >7.5 mmol/L (ref 3.5–5.1)
Sodium: 142 mmol/L (ref 135–145)

## 2017-02-21 LAB — CBC
HCT: 23.7 % — ABNORMAL LOW (ref 36.0–46.0)
HEMATOCRIT: 10.3 % — AB (ref 36.0–46.0)
HEMOGLOBIN: 7.8 g/dL — AB (ref 12.0–15.0)
HEMOGLOBIN: 3.3 g/dL — AB (ref 12.0–15.0)
MCH: 29.8 pg (ref 26.0–34.0)
MCH: 29.5 pg (ref 26.0–34.0)
MCHC: 32.9 g/dL (ref 30.0–36.0)
MCHC: 32 g/dL (ref 30.0–36.0)
MCV: 90.5 fL (ref 78.0–100.0)
MCV: 92 fL (ref 78.0–100.0)
Platelets: 48 10*3/uL — ABNORMAL LOW (ref 150–400)
Platelets: 99 10*3/uL — ABNORMAL LOW (ref 150–400)
RBC: 2.62 MIL/uL — AB (ref 3.87–5.11)
RBC: 1.12 MIL/uL — ABNORMAL LOW (ref 3.87–5.11)
RDW: 15.2 % (ref 11.5–15.5)
RDW: 16.6 % — ABNORMAL HIGH (ref 11.5–15.5)
WBC: 10.3 10*3/uL (ref 4.0–10.5)
WBC: 7.3 10*3/uL (ref 4.0–10.5)

## 2017-02-21 LAB — COMPREHENSIVE METABOLIC PANEL
ALT: 5003 U/L — AB (ref 14–54)
AST: >10000 U/L — ABNORMAL HIGH (ref 15–41)
Albumin: 1.9 g/dL — ABNORMAL LOW (ref 3.5–5.0)
Alkaline Phosphatase: 141 U/L — ABNORMAL HIGH (ref 38–126)
Anion gap: 26 — ABNORMAL HIGH (ref 5–15)
BUN: 27 mg/dL — ABNORMAL HIGH (ref 6–20)
CHLORIDE: 111 mmol/L (ref 101–111)
CO2: 8 mmol/L — AB (ref 22–32)
CREATININE: 3.91 mg/dL — AB (ref 0.44–1.00)
Calcium: 5.7 mg/dL — CL (ref 8.9–10.3)
GFR, EST AFRICAN AMERICAN: 14 mL/min — AB (ref >60)
GFR, EST NON AFRICAN AMERICAN: 12 mL/min — AB (ref >60)
Glucose, Bld: 89 mg/dL (ref 65–99)
Potassium: 6.7 mmol/L (ref 3.5–5.1)
SODIUM: 145 mmol/L (ref 135–145)
Total Bilirubin: 1.4 mg/dL — ABNORMAL HIGH (ref 0.3–1.2)
Total Protein: 3.8 g/dL — ABNORMAL LOW (ref 6.5–8.1)

## 2017-02-21 LAB — DIC (DISSEMINATED INTRAVASCULAR COAGULATION)PANEL
Fibrinogen: 214 mg/dL (ref 210–475)
INR: 2.49
Platelets: 45 10*3/uL — ABNORMAL LOW (ref 150–400)

## 2017-02-21 LAB — DIC (DISSEMINATED INTRAVASCULAR COAGULATION) PANEL
APTT: 57 s — AB (ref 24–36)
PROTHROMBIN TIME: 27.4 s — AB (ref 11.4–15.2)
SMEAR REVIEW: NONE SEEN

## 2017-02-21 LAB — PREPARE PLATELET PHERESIS: UNIT DIVISION: 0

## 2017-02-21 LAB — PROTIME-INR
INR: 3.07
Prothrombin Time: 32.4 seconds — ABNORMAL HIGH (ref 11.4–15.2)

## 2017-02-21 LAB — BPAM PLATELET PHERESIS
Blood Product Expiration Date: 201805142359
ISSUE DATE / TIME: 201805130037
Unit Type and Rh: 6200

## 2017-02-21 LAB — APTT: aPTT: 80 seconds — ABNORMAL HIGH (ref 24–36)

## 2017-02-21 LAB — LACTIC ACID, PLASMA: Lactic Acid, Venous: 17.9 mmol/L (ref 0.5–1.9)

## 2017-02-21 MED ORDER — SODIUM CHLORIDE 0.9 % IV SOLN
2.0000 g | Freq: Once | INTRAVENOUS | Status: AC
  Administered 2017-02-21: 2 g via INTRAVENOUS
  Filled 2017-02-21: qty 20

## 2017-02-21 MED ORDER — SODIUM POLYSTYRENE SULFONATE 15 GM/60ML PO SUSP
30.0000 g | Freq: Once | ORAL | Status: AC
  Administered 2017-02-21: 30 g
  Filled 2017-02-21: qty 120

## 2017-02-21 MED ORDER — VASOPRESSIN 20 UNIT/ML IV SOLN
0.0300 [IU]/min | INTRAVENOUS | Status: DC
  Administered 2017-02-21: 0.03 [IU]/min via INTRAVENOUS
  Filled 2017-02-21: qty 2

## 2017-02-21 MED ORDER — CEFAZOLIN SODIUM-DEXTROSE 2-4 GM/100ML-% IV SOLN
2.0000 g | Freq: Two times a day (BID) | INTRAVENOUS | Status: DC
  Filled 2017-02-21: qty 100

## 2017-02-21 MED ORDER — SODIUM CHLORIDE 0.9 % IV SOLN: Freq: Once | INTRAVENOUS | Status: DC

## 2017-02-21 MED ORDER — SODIUM BICARBONATE 8.4 % IV SOLN
INTRAVENOUS | Status: DC
  Administered 2017-02-21: 02:00:00 via INTRAVENOUS
  Filled 2017-02-21 (×5): qty 150

## 2017-02-21 MED ORDER — CHLORHEXIDINE GLUCONATE CLOTH 2 % EX PADS
6.0000 | MEDICATED_PAD | Freq: Every day | CUTANEOUS | Status: DC
  Administered 2017-02-21: 6 via TOPICAL

## 2017-02-22 ENCOUNTER — Encounter (HOSPITAL_COMMUNITY): Payer: Self-pay

## 2017-02-22 LAB — TYPE AND SCREEN
ABO/RH(D): A POS
ABO/RH(D): A POS
Antibody Screen: NEGATIVE
Antibody Screen: NEGATIVE
UNIT DIVISION: 0
UNIT DIVISION: 0
UNIT DIVISION: 0
UNIT DIVISION: 0
UNIT DIVISION: 0
UNIT DIVISION: 0
UNIT DIVISION: 0
UNIT DIVISION: 0
UNIT DIVISION: 0
UNIT DIVISION: 0
UNIT DIVISION: 0
UNIT DIVISION: 0
UNIT DIVISION: 0
UNIT DIVISION: 0
UNIT DIVISION: 0
UNIT DIVISION: 0
UNIT DIVISION: 0
UNIT DIVISION: 0
UNIT DIVISION: 0
UNIT DIVISION: 0
Unit division: 0
Unit division: 0
Unit division: 0
Unit division: 0
Unit division: 0
Unit division: 0
Unit division: 0
Unit division: 0
Unit division: 0
Unit division: 0
Unit division: 0
Unit division: 0
Unit division: 0
Unit division: 0
Unit division: 0
Unit division: 0
Unit division: 0
Unit division: 0
Unit division: 0
Unit division: 0
Unit division: 0
Unit division: 0
Unit division: 0

## 2017-02-22 LAB — BPAM FFP
BLOOD PRODUCT EXPIRATION DATE: 201805172359
BLOOD PRODUCT EXPIRATION DATE: 201805172359
BLOOD PRODUCT EXPIRATION DATE: 201805172359
BLOOD PRODUCT EXPIRATION DATE: 201805172359
BLOOD PRODUCT EXPIRATION DATE: 201805172359
BLOOD PRODUCT EXPIRATION DATE: 201805172359
BLOOD PRODUCT EXPIRATION DATE: 201805172359
BLOOD PRODUCT EXPIRATION DATE: 201805172359
BLOOD PRODUCT EXPIRATION DATE: 201805172359
BLOOD PRODUCT EXPIRATION DATE: 201805172359
BLOOD PRODUCT EXPIRATION DATE: 201805172359
BLOOD PRODUCT EXPIRATION DATE: 201805282359
BLOOD PRODUCT EXPIRATION DATE: 201805172359
BLOOD PRODUCT EXPIRATION DATE: 201805172359
BLOOD PRODUCT EXPIRATION DATE: 201805172359
Blood Product Expiration Date: 201805172359
Blood Product Expiration Date: 201805172359
Blood Product Expiration Date: 201805172359
Blood Product Expiration Date: 201805172359
Blood Product Expiration Date: 201805172359
Blood Product Expiration Date: 201805172359
Blood Product Expiration Date: 201805172359
Blood Product Expiration Date: 201805172359
Blood Product Expiration Date: 201805172359
Blood Product Expiration Date: 201805172359
Blood Product Expiration Date: 201805172359
Blood Product Expiration Date: 201805172359
Blood Product Expiration Date: 201805172359
Blood Product Expiration Date: 201805172359
Blood Product Expiration Date: 201805172359
Blood Product Expiration Date: 201805172359
Blood Product Expiration Date: 201805172359
Blood Product Expiration Date: 201805212359
Blood Product Expiration Date: 201805212359
Blood Product Expiration Date: 201805282359
Blood Product Expiration Date: 201805172359
ISSUE DATE / TIME: 201805121925
ISSUE DATE / TIME: 201805121925
ISSUE DATE / TIME: 201805122056
ISSUE DATE / TIME: 201805122056
ISSUE DATE / TIME: 201805122118
ISSUE DATE / TIME: 201805122118
ISSUE DATE / TIME: 201805122118
ISSUE DATE / TIME: 201805122118
ISSUE DATE / TIME: 201805122145
ISSUE DATE / TIME: 201805122145
ISSUE DATE / TIME: 201805122145
ISSUE DATE / TIME: 201805122145
ISSUE DATE / TIME: 201805122157
ISSUE DATE / TIME: 201805122157
ISSUE DATE / TIME: 201805122157
ISSUE DATE / TIME: 201805122157
ISSUE DATE / TIME: 201805122220
ISSUE DATE / TIME: 201805122220
ISSUE DATE / TIME: 201805122235
ISSUE DATE / TIME: 201805122235
ISSUE DATE / TIME: 201805122252
ISSUE DATE / TIME: 201805122252
ISSUE DATE / TIME: 201805122252
ISSUE DATE / TIME: 201805122252
ISSUE DATE / TIME: 201805131726
ISSUE DATE / TIME: 201805131852
ISSUE DATE / TIME: 201805140115
ISSUE DATE / TIME: 201805140301
ISSUE DATE / TIME: 201805140744
ISSUE DATE / TIME: 201805140933
ISSUE DATE / TIME: 201805141542
ISSUE DATE / TIME: 201805141542
ISSUE DATE / TIME: 201805131726
ISSUE DATE / TIME: 201805140301
ISSUE DATE / TIME: 201805140744
ISSUE DATE / TIME: 201805140933
UNIT TYPE AND RH: 6200
UNIT TYPE AND RH: 6200
UNIT TYPE AND RH: 6200
UNIT TYPE AND RH: 6200
UNIT TYPE AND RH: 6200
UNIT TYPE AND RH: 6200
UNIT TYPE AND RH: 6200
UNIT TYPE AND RH: 6200
UNIT TYPE AND RH: 6200
UNIT TYPE AND RH: 6200
UNIT TYPE AND RH: 6200
UNIT TYPE AND RH: 6200
UNIT TYPE AND RH: 6200
UNIT TYPE AND RH: 6200
UNIT TYPE AND RH: 6200
UNIT TYPE AND RH: 6200
UNIT TYPE AND RH: 6200
UNIT TYPE AND RH: 6200
UNIT TYPE AND RH: 6200
UNIT TYPE AND RH: 6200
UNIT TYPE AND RH: 600
UNIT TYPE AND RH: 6200
Unit Type and Rh: 600
Unit Type and Rh: 600
Unit Type and Rh: 600
Unit Type and Rh: 6200
Unit Type and Rh: 6200
Unit Type and Rh: 6200
Unit Type and Rh: 6200
Unit Type and Rh: 6200
Unit Type and Rh: 6200
Unit Type and Rh: 6200
Unit Type and Rh: 6200
Unit Type and Rh: 6200
Unit Type and Rh: 6200
Unit Type and Rh: 6200

## 2017-02-22 LAB — PREPARE FRESH FROZEN PLASMA
UNIT DIVISION: 0
UNIT DIVISION: 0
UNIT DIVISION: 0
UNIT DIVISION: 0
UNIT DIVISION: 0
UNIT DIVISION: 0
UNIT DIVISION: 0
UNIT DIVISION: 0
UNIT DIVISION: 0
UNIT DIVISION: 0
UNIT DIVISION: 0
UNIT DIVISION: 0
UNIT DIVISION: 0
UNIT DIVISION: 0
UNIT DIVISION: 0
UNIT DIVISION: 0
UNIT DIVISION: 0
UNIT DIVISION: 0
UNIT DIVISION: 0
UNIT DIVISION: 0
Unit division: 0
Unit division: 0
Unit division: 0
Unit division: 0
Unit division: 0
Unit division: 0
Unit division: 0
Unit division: 0
Unit division: 0
Unit division: 0
Unit division: 0
Unit division: 0
Unit division: 0
Unit division: 0
Unit division: 0
Unit division: 0

## 2017-02-22 LAB — BPAM RBC
BLOOD PRODUCT EXPIRATION DATE: 201805232359
BLOOD PRODUCT EXPIRATION DATE: 201805232359
BLOOD PRODUCT EXPIRATION DATE: 201805242359
BLOOD PRODUCT EXPIRATION DATE: 201805242359
BLOOD PRODUCT EXPIRATION DATE: 201805282359
BLOOD PRODUCT EXPIRATION DATE: 201805312359
BLOOD PRODUCT EXPIRATION DATE: 201806062359
BLOOD PRODUCT EXPIRATION DATE: 201806062359
BLOOD PRODUCT EXPIRATION DATE: 201806072359
BLOOD PRODUCT EXPIRATION DATE: 201806072359
BLOOD PRODUCT EXPIRATION DATE: 201806072359
BLOOD PRODUCT EXPIRATION DATE: 201806072359
BLOOD PRODUCT EXPIRATION DATE: 201806072359
BLOOD PRODUCT EXPIRATION DATE: 201806072359
BLOOD PRODUCT EXPIRATION DATE: 201806082359
BLOOD PRODUCT EXPIRATION DATE: 201806082359
BLOOD PRODUCT EXPIRATION DATE: 201806082359
BLOOD PRODUCT EXPIRATION DATE: 201806082359
BLOOD PRODUCT EXPIRATION DATE: 201806102359
BLOOD PRODUCT EXPIRATION DATE: 201805282359
BLOOD PRODUCT EXPIRATION DATE: 201805282359
Blood Product Expiration Date: 201805242359
Blood Product Expiration Date: 201805312359
Blood Product Expiration Date: 201806022359
Blood Product Expiration Date: 201806062359
Blood Product Expiration Date: 201806062359
Blood Product Expiration Date: 201806072359
Blood Product Expiration Date: 201806072359
Blood Product Expiration Date: 201806072359
Blood Product Expiration Date: 201806072359
Blood Product Expiration Date: 201806072359
Blood Product Expiration Date: 201806072359
Blood Product Expiration Date: 201806072359
Blood Product Expiration Date: 201806072359
Blood Product Expiration Date: 201806072359
Blood Product Expiration Date: 201806072359
Blood Product Expiration Date: 201806082359
Blood Product Expiration Date: 201806082359
Blood Product Expiration Date: 201806082359
Blood Product Expiration Date: 201806102359
Blood Product Expiration Date: 201805242359
Blood Product Expiration Date: 201805242359
Blood Product Expiration Date: 201805242359
ISSUE DATE / TIME: 201805121923
ISSUE DATE / TIME: 201805121923
ISSUE DATE / TIME: 201805122030
ISSUE DATE / TIME: 201805122030
ISSUE DATE / TIME: 201805122050
ISSUE DATE / TIME: 201805122050
ISSUE DATE / TIME: 201805122126
ISSUE DATE / TIME: 201805122126
ISSUE DATE / TIME: 201805122126
ISSUE DATE / TIME: 201805122126
ISSUE DATE / TIME: 201805122154
ISSUE DATE / TIME: 201805122154
ISSUE DATE / TIME: 201805122154
ISSUE DATE / TIME: 201805122154
ISSUE DATE / TIME: 201805122205
ISSUE DATE / TIME: 201805122205
ISSUE DATE / TIME: 201805122205
ISSUE DATE / TIME: 201805122205
ISSUE DATE / TIME: 201805122214
ISSUE DATE / TIME: 201805122214
ISSUE DATE / TIME: 201805122214
ISSUE DATE / TIME: 201805122217
ISSUE DATE / TIME: 201805122228
ISSUE DATE / TIME: 201805122228
ISSUE DATE / TIME: 201805122228
ISSUE DATE / TIME: 201805122228
ISSUE DATE / TIME: 201805122232
ISSUE DATE / TIME: 201805122232
ISSUE DATE / TIME: 201805131921
ISSUE DATE / TIME: 201805131921
ISSUE DATE / TIME: 201805131921
ISSUE DATE / TIME: 201805131921
ISSUE DATE / TIME: 201805140010
ISSUE DATE / TIME: 201805140047
ISSUE DATE / TIME: 201805142020
ISSUE DATE / TIME: 201805142150
ISSUE DATE / TIME: 201805131449
ISSUE DATE / TIME: 201805140010
ISSUE DATE / TIME: 201805140047
UNIT TYPE AND RH: 5100
UNIT TYPE AND RH: 5100
UNIT TYPE AND RH: 5100
UNIT TYPE AND RH: 5100
UNIT TYPE AND RH: 5100
UNIT TYPE AND RH: 5100
UNIT TYPE AND RH: 5100
UNIT TYPE AND RH: 5100
UNIT TYPE AND RH: 5100
UNIT TYPE AND RH: 5100
UNIT TYPE AND RH: 5100
UNIT TYPE AND RH: 5100
UNIT TYPE AND RH: 5100
UNIT TYPE AND RH: 5100
UNIT TYPE AND RH: 6200
UNIT TYPE AND RH: 6200
UNIT TYPE AND RH: 6200
UNIT TYPE AND RH: 9500
UNIT TYPE AND RH: 9500
UNIT TYPE AND RH: 9500
UNIT TYPE AND RH: 6200
UNIT TYPE AND RH: 6200
UNIT TYPE AND RH: 6200
Unit Type and Rh: 5100
Unit Type and Rh: 5100
Unit Type and Rh: 5100
Unit Type and Rh: 5100
Unit Type and Rh: 5100
Unit Type and Rh: 5100
Unit Type and Rh: 5100
Unit Type and Rh: 5100
Unit Type and Rh: 5100
Unit Type and Rh: 5100
Unit Type and Rh: 6200
Unit Type and Rh: 9500
Unit Type and Rh: 9500
Unit Type and Rh: 9500
Unit Type and Rh: 9500
Unit Type and Rh: 9500
Unit Type and Rh: 9500
Unit Type and Rh: 9500
Unit Type and Rh: 6200
Unit Type and Rh: 6200

## 2017-02-22 LAB — PREPARE PLATELET PHERESIS
Unit division: 0
Unit division: 0

## 2017-02-22 LAB — BPAM PLATELET PHERESIS
Blood Product Expiration Date: 201805142359
Blood Product Expiration Date: 201805142359
ISSUE DATE / TIME: 201805140557
ISSUE DATE / TIME: 201805141116
Unit Type and Rh: 5100
Unit Type and Rh: 6200

## 2017-03-11 NOTE — Progress Notes (Signed)
Asystole noted on the monitor, verified by Susa RaringLisa RN and [facility]. Family at bedside. MD notified.

## 2017-03-11 NOTE — Progress Notes (Signed)
Multiple pages with MD regarding critical lab result, HgB of 6.6, critical ecg, changes in cardiac rhythm and changes in needs for blood pressure support.   Md spoken with at  1137 am regarding Hgb of 6.6 order received for blood products 12 15 am regarding changes in pressure and rhythm order received for NS bolus and to bolus packed RBC.  0108 regarding continued rhythm instability, vasiliating between normal and bradycardia in 40s, order recevied for ABG.  0141 regarding ABG results order received for bicarb drip.
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
## 2017-03-11 NOTE — Progress Notes (Deleted)
Stopped by and visited w/ pt's 2 family members in rm, including older brother Fayrene FearingJames. Provided emotional/spiritual support and prayer -- which they all appreciated. Nurse says pt has at times had feeling in extremities and seemed to come thru surgery ok, so family is taking some hope, especially as young as she is, for recovery. Chaplain available for f/u.   03/04/2017 1600  Clinical Encounter Type  Visited With Patient and family together;Health care provider  Visit Type Follow-up;Psychological support;Spiritual support;Social support;Critical Care  Referral From Chaplain  Spiritual Encounters  Spiritual Needs Prayer;Emotional  Stress Factors  Patient Stress Factors Health changes;Loss of control;Major life changes  Family Stress Factors Family relationships;Health changes;Loss;Loss of control   Ephraim Hamburgerynthia A Jaidin Ugarte, [address] Hospital Roadhaplain

## 2017-03-11 NOTE — Progress Notes (Signed)
Stopped by to visit with pt's family in rm, her older brother Jeneen Rinks and an older woman had not yet met. Provided emotional/spiritual support and prayer -- which they appreciated.    Jeneen Rinks seems realistic at this point re: his sister's poor prognosis even tho' he still says she's a Nurse, adult -- e.g., he made her DNR. Based on tracking of pt's physical status this afternoon, nurse does not expect pt to be able to survive much longer. Will alert night chaplain coming on at 1700 to standby for any family needs.   16-Mar-2017 1600  Clinical Encounter Type  Visited With Patient and family together;Health care provider  Visit Type Follow-up;Psychological support;Spiritual support;Social support;Critical Care  Referral From Chaplain  Spiritual Encounters  Spiritual Needs Prayer;Emotional  Stress Factors  Patient Stress Factors Health changes;Loss of control;Major life changes  Family Stress Factors Family relationships;Health changes;Loss;Loss of control   Gerrit Heck, Chaplain

## 2017-03-11 NOTE — Progress Notes (Signed)
Paged MD regarding Critical Potassium and Calcium results, also discussed liver enzymes. Orders received for calcium replacement and Kayexalate.

## 2017-03-11 NOTE — Progress Notes (Signed)
Chaplain responded to the request of the Nursing Staff to provide spiritual and emotional support for the family of the patient who are a very difficult with the critical medical status of the patient. Chaplain offered ministry of presence, prayer of comfort and peace for the as  life stories of the relationship she had with her sister.  Chaplain encouraged her to continue to spend time with the patient for moral support. Chaplain will follow up as needed, Chaplain Janell Quietudrey Wille Aubuchon [phone]

## 2017-03-11 NOTE — Progress Notes (Signed)
PHARMACY NOTE:  ANTIMICROBIAL RENAL DOSAGE ADJUSTMENT  Current antimicrobial regimen includes a mismatch between antimicrobial dosage and estimated renal function.  As per policy approved by the Pharmacy & Therapeutics and Medical Executive Committees, the antimicrobial dosage will be adjusted accordingly.  Current antimicrobial dosage:  Cefazolin 2gm IV Q8H  Indication: wound infection  Renal Function:  Estimated Creatinine Clearance: 18 mL/min (A) (by C-G formula based on SCr of 3.91 mg/dL (H)). []      On intermittent HD, scheduled: []      On CRRT    Antimicrobial dosage has been changed to:  Cefazolin 2gm IV Q12H  Additional comments:    Thank you for allowing pharmacy to be a part of this patient's care.  Yousra Ivens, Drake LeachRachel Lynn, [facility] Laser CtrRPH 02/18/2017 7:28 AM
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
## 2017-03-11 NOTE — Progress Notes (Signed)
Stopped by to visit w/ pt's family and friends in rm. Pt's friends, a couple and their son, were there, and the wife in the couple (who was praying for pt when I entered rm) is involved w/ pt's son, who has seizures, lives in a home, and has a caretaker. She has not told him yet of his mom's condition. Pt's nurse let her know it would not necessarily be long before med staff may need to speak with family decision-makers.   We compared notes on phone nos. in pt's record and family members we'd seen, and concluded that nurse has name and no. for pt's older brother Fayrene FearingJames and younger sister Lanice SchwabMisty ( whom visitor said also = Gladys). (I'd taken 2 sisters and 2 brothers to consultation rm to meet w/ doctor immediately post-surgery early Sunday monring.) Provided emotional/spiritual support and (much-appreciated) prayer to pt's visitors in rm. Chaplain available for f/u.

## 2017-03-11 NOTE — Progress Notes (Signed)
Spoke with MD regarding Critical ABG results. Orders for blood products received.
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
## 2017-03-11 NOTE — Progress Notes (Signed)
RT NOTE:  Critical ABG results reported to Maisie Fushomas, Charity fundraiserN.
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
## 2017-03-11 DEATH — deceased
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
